# Patient Record
Sex: Female | Born: 1960
Health system: Southern US, Community
[De-identification: ages and names within clinical notes are randomized; demographics above are authoritative.]

## PROBLEM LIST (undated history)

## (undated) DIAGNOSIS — M797 Fibromyalgia: Secondary | ICD-10-CM

## (undated) DIAGNOSIS — K219 Gastro-esophageal reflux disease without esophagitis: Secondary | ICD-10-CM

## (undated) DIAGNOSIS — I10 Essential (primary) hypertension: Secondary | ICD-10-CM

## (undated) DIAGNOSIS — E785 Hyperlipidemia, unspecified: Secondary | ICD-10-CM

## (undated) DIAGNOSIS — R7303 Prediabetes: Secondary | ICD-10-CM

## (undated) DIAGNOSIS — M1990 Unspecified osteoarthritis, unspecified site: Secondary | ICD-10-CM

## (undated) DIAGNOSIS — Z862 Personal history of diseases of the blood and blood-forming organs and certain disorders involving the immune mechanism: Secondary | ICD-10-CM

## (undated) DIAGNOSIS — D759 Disease of blood and blood-forming organs, unspecified: Secondary | ICD-10-CM

## (undated) HISTORY — DX: Prediabetes: R73.03

## (undated) HISTORY — DX: Hyperlipidemia, unspecified: E78.5

---

## 1997-07-24 ENCOUNTER — Other Ambulatory Visit: Admission: RE | Admit: 1997-07-24 | Discharge: 1997-07-24 | Payer: Self-pay | Admitting: Obstetrics and Gynecology

## 1997-09-13 ENCOUNTER — Emergency Department (HOSPITAL_COMMUNITY): Admission: EM | Admit: 1997-09-13 | Discharge: 1997-09-13 | Payer: Self-pay | Admitting: Emergency Medicine

## 1997-09-13 ENCOUNTER — Encounter: Admission: RE | Admit: 1997-09-13 | Discharge: 1997-09-13 | Payer: Self-pay | Admitting: *Deleted

## 1999-12-19 ENCOUNTER — Emergency Department (HOSPITAL_COMMUNITY): Admission: EM | Admit: 1999-12-19 | Discharge: 1999-12-19 | Payer: Self-pay | Admitting: Emergency Medicine

## 1999-12-19 ENCOUNTER — Encounter: Payer: Self-pay | Admitting: Emergency Medicine

## 2000-02-24 ENCOUNTER — Encounter: Payer: Self-pay | Admitting: *Deleted

## 2000-02-24 ENCOUNTER — Emergency Department (HOSPITAL_COMMUNITY): Admission: EM | Admit: 2000-02-24 | Discharge: 2000-02-24 | Payer: Self-pay | Admitting: *Deleted

## 2000-11-09 ENCOUNTER — Encounter: Payer: Self-pay | Admitting: Emergency Medicine

## 2000-11-09 ENCOUNTER — Emergency Department (HOSPITAL_COMMUNITY): Admission: EM | Admit: 2000-11-09 | Discharge: 2000-11-09 | Payer: Self-pay | Admitting: Emergency Medicine

## 2000-11-18 ENCOUNTER — Emergency Department (HOSPITAL_COMMUNITY): Admission: EM | Admit: 2000-11-18 | Discharge: 2000-11-19 | Payer: Self-pay | Admitting: Emergency Medicine

## 2000-11-18 ENCOUNTER — Encounter: Payer: Self-pay | Admitting: Emergency Medicine

## 2002-05-10 ENCOUNTER — Emergency Department (HOSPITAL_COMMUNITY): Admission: EM | Admit: 2002-05-10 | Discharge: 2002-05-10 | Payer: Self-pay | Admitting: Emergency Medicine

## 2002-06-07 ENCOUNTER — Emergency Department (HOSPITAL_COMMUNITY): Admission: EM | Admit: 2002-06-07 | Discharge: 2002-06-07 | Payer: Self-pay | Admitting: *Deleted

## 2004-05-15 ENCOUNTER — Emergency Department (HOSPITAL_COMMUNITY): Admission: EM | Admit: 2004-05-15 | Discharge: 2004-05-15 | Payer: Self-pay | Admitting: Emergency Medicine

## 2004-06-04 ENCOUNTER — Emergency Department (HOSPITAL_COMMUNITY): Admission: EM | Admit: 2004-06-04 | Discharge: 2004-06-04 | Payer: Self-pay | Admitting: Family Medicine

## 2005-05-12 ENCOUNTER — Emergency Department (HOSPITAL_COMMUNITY): Admission: EM | Admit: 2005-05-12 | Discharge: 2005-05-12 | Payer: Self-pay | Admitting: Emergency Medicine

## 2005-12-16 ENCOUNTER — Emergency Department (HOSPITAL_COMMUNITY): Admission: EM | Admit: 2005-12-16 | Discharge: 2005-12-16 | Payer: Self-pay | Admitting: Emergency Medicine

## 2006-11-24 ENCOUNTER — Emergency Department (HOSPITAL_COMMUNITY): Admission: EM | Admit: 2006-11-24 | Discharge: 2006-11-24 | Payer: Self-pay | Admitting: Emergency Medicine

## 2007-12-25 ENCOUNTER — Emergency Department (HOSPITAL_COMMUNITY): Admission: EM | Admit: 2007-12-25 | Discharge: 2007-12-25 | Payer: Self-pay | Admitting: Emergency Medicine

## 2010-04-27 ENCOUNTER — Encounter (INDEPENDENT_AMBULATORY_CARE_PROVIDER_SITE_OTHER): Payer: Self-pay | Admitting: Nurse Practitioner

## 2010-04-27 ENCOUNTER — Encounter: Payer: Self-pay | Admitting: Nurse Practitioner

## 2010-04-27 DIAGNOSIS — I10 Essential (primary) hypertension: Secondary | ICD-10-CM | POA: Insufficient documentation

## 2010-05-05 NOTE — Progress Notes (Signed)
Summary: Office Visit//HEALTH SCREEN  Office Visit//HEALTH SCREEN   Imported By: Arta Bruce 05/01/2010 12:08:41  _____________________________________________________________________  External Attachment:    Type:   Image     Comment:   External Document

## 2010-05-05 NOTE — Assessment & Plan Note (Signed)
Summary: HTN   Vital Signs:  Patient profile:   50 year old female Menstrual status:  regular LMP:     04/2010 Weight:      151.0 pounds Temp:     98.0 degrees F oral Pulse rate:   73 / minute Pulse rhythm:   regular Resp:     16 per minute BP sitting:   166 / 116  (left arm) Cuff size:   regular  Vitals Entered By: Levon Hedger (April 27, 2010 12:36 PM) CC: re establish....needs a physical, Hypertension Management Is Patient Diabetic? No Pain Assessment Patient in pain? no       Does patient need assistance? Functional Status Self care Ambulation Normal LMP (date): 04/2010     Menstrual Status regular Enter LMP: 04/2010   CC:  re establish....needs a physical and Hypertension Management.  History of Present Illness:  Pt into the office to establish care  No acute problems today. She does want to schedule an appt for a CPE  Hypertension History:      She denies headache, chest pain, and palpitations.  she has checked.        Positive major cardiovascular risk factors include hypertension.  Negative major cardiovascular risk factors include female age less than 25 years old, no history of diabetes or hyperlipidemia, and non-tobacco-user status.        Further assessment for target organ damage reveals no history of ASHD, cardiac end-organ damage (CHF/LVH), or stroke/TIA.     Habits & Providers  Alcohol-Tobacco-Diet     Alcohol drinks/day: 0     Tobacco Status: quit < 6 months     Tobacco Counseling: to remain off tobacco products     Year Quit: 02/2010  Exercise-Depression-Behavior     Drug Use: never  Medications Prior to Update: 1)  None  Allergies (verified): No Known Drug Allergies  Past History:  Past Surgical History: c-section x 2 tubal ligation  Family History: mother - deceased when pt was 37 yrs old father - CAD   Social History: 2 children tobacco - quit 02/2010 ETOH - none  drug - noneSmoking Status:  quit < 6 months Drug Use:   never Ethnicity:  Black  Review of Systems General:  Denies fever. CV:  Denies chest pain or discomfort. Resp:  Denies cough. GI:  Denies abdominal pain, nausea, and vomiting. MS:  Denies joint pain.  Physical Exam  General:  alert.   Head:  normocephalic.   Eyes:  glasses Lungs:  normal breath sounds.   Heart:  normal rate and regular rhythm.   Msk:  up to the exam table Neurologic:  alert & oriented X3.   Skin:  color normal.   Psych:  Oriented X3.     Impression & Recommendations:  Problem # 1:  HYPERTENSION, BENIGN ESSENTIAL (ICD-401.1) DASH diet will need to start meds (handout given to pt) Her updated medication list for this problem includes:    Lisinopril-hydrochlorothiazide 10-12.5 Mg Tabs (Lisinopril-hydrochlorothiazide) ..... One tablet by mouth daily for blood pressure  Complete Medication List: 1)  Lisinopril-hydrochlorothiazide 10-12.5 Mg Tabs (Lisinopril-hydrochlorothiazide) .... One tablet by mouth daily for blood pressure  Hypertension Assessment/Plan:      The patient's hypertensive risk group is category A: No risk factors and no target organ damage.  Today's blood pressure is 166/116.  Her blood pressure goal is < 140/90.  Patient Instructions: 1)  Follow up for a blood pressure check in 2 weeks. 2)  Take your medications before  this visit. 3)  Follow up for an appointment for a complete physical exam. 4)  No food after midnight before this visit. 5)  You will need PAP, EKG, mammogram, labs. Prescriptions: LISINOPRIL-HYDROCHLOROTHIAZIDE 10-12.5 MG TABS (LISINOPRIL-HYDROCHLOROTHIAZIDE) One tablet by mouth daily for blood pressure  #30 x 5   Entered and Authorized by:   Lehman Prom FNP   Signed by:   Lehman Prom FNP on 04/27/2010   Method used:   Print then Give to Patient   RxID:   [id number]    Orders Added: 1)  New Patient Level III [14782]

## 2010-07-06 ENCOUNTER — Other Ambulatory Visit (HOSPITAL_COMMUNITY): Payer: Self-pay | Admitting: Family Medicine

## 2010-07-06 ENCOUNTER — Other Ambulatory Visit: Payer: Self-pay | Admitting: Family Medicine

## 2010-07-06 DIAGNOSIS — Z1231 Encounter for screening mammogram for malignant neoplasm of breast: Secondary | ICD-10-CM

## 2010-07-15 ENCOUNTER — Ambulatory Visit (HOSPITAL_COMMUNITY)
Admission: RE | Admit: 2010-07-15 | Discharge: 2010-07-15 | Disposition: A | Discharge status: Home / Self Care | Payer: Medicaid Other | Source: Ambulatory Visit | Attending: Family Medicine | Admitting: Family Medicine

## 2010-07-15 DIAGNOSIS — Z1231 Encounter for screening mammogram for malignant neoplasm of breast: Secondary | ICD-10-CM | POA: Insufficient documentation

## 2010-08-23 ENCOUNTER — Emergency Department (HOSPITAL_COMMUNITY): Payer: Medicaid Other

## 2010-08-23 ENCOUNTER — Emergency Department (HOSPITAL_COMMUNITY)
Admission: EM | Admit: 2010-08-23 | Discharge: 2010-08-23 | Disposition: A | Discharge status: Home / Self Care | Payer: Medicaid Other | Attending: Emergency Medicine | Admitting: Emergency Medicine

## 2010-08-23 DIAGNOSIS — R059 Cough, unspecified: Secondary | ICD-10-CM | POA: Insufficient documentation

## 2010-08-23 DIAGNOSIS — R05 Cough: Secondary | ICD-10-CM | POA: Insufficient documentation

## 2010-08-23 DIAGNOSIS — I10 Essential (primary) hypertension: Secondary | ICD-10-CM | POA: Insufficient documentation

## 2011-08-12 ENCOUNTER — Other Ambulatory Visit (HOSPITAL_COMMUNITY): Payer: Self-pay | Admitting: Family Medicine

## 2011-08-12 DIAGNOSIS — Z1231 Encounter for screening mammogram for malignant neoplasm of breast: Secondary | ICD-10-CM

## 2011-08-13 ENCOUNTER — Ambulatory Visit (HOSPITAL_COMMUNITY)
Admission: RE | Admit: 2011-08-13 | Discharge: 2011-08-13 | Disposition: A | Discharge status: Home / Self Care | Payer: Medicaid Other | Source: Ambulatory Visit | Attending: Family Medicine | Admitting: Family Medicine

## 2011-08-13 DIAGNOSIS — Z1231 Encounter for screening mammogram for malignant neoplasm of breast: Secondary | ICD-10-CM | POA: Insufficient documentation

## 2012-02-28 ENCOUNTER — Ambulatory Visit: Payer: Medicaid Other | Admitting: Family Medicine

## 2012-04-27 ENCOUNTER — Emergency Department (HOSPITAL_COMMUNITY): Payer: Medicaid Other

## 2012-04-27 ENCOUNTER — Emergency Department (HOSPITAL_COMMUNITY)
Admission: EM | Admit: 2012-04-27 | Discharge: 2012-04-27 | Disposition: A | Discharge status: Home / Self Care | Payer: Medicaid Other | Attending: Emergency Medicine | Admitting: Emergency Medicine

## 2012-04-27 ENCOUNTER — Encounter (HOSPITAL_COMMUNITY): Payer: Self-pay | Admitting: Emergency Medicine

## 2012-04-27 DIAGNOSIS — R202 Paresthesia of skin: Secondary | ICD-10-CM

## 2012-04-27 DIAGNOSIS — I10 Essential (primary) hypertension: Secondary | ICD-10-CM | POA: Insufficient documentation

## 2012-04-27 DIAGNOSIS — M47812 Spondylosis without myelopathy or radiculopathy, cervical region: Secondary | ICD-10-CM | POA: Insufficient documentation

## 2012-04-27 DIAGNOSIS — R209 Unspecified disturbances of skin sensation: Secondary | ICD-10-CM | POA: Insufficient documentation

## 2012-04-27 DIAGNOSIS — F17200 Nicotine dependence, unspecified, uncomplicated: Secondary | ICD-10-CM | POA: Insufficient documentation

## 2012-04-27 DIAGNOSIS — M25519 Pain in unspecified shoulder: Secondary | ICD-10-CM | POA: Insufficient documentation

## 2012-04-27 DIAGNOSIS — M25512 Pain in left shoulder: Secondary | ICD-10-CM

## 2012-04-27 DIAGNOSIS — M542 Cervicalgia: Secondary | ICD-10-CM | POA: Insufficient documentation

## 2012-04-27 HISTORY — DX: Essential (primary) hypertension: I10

## 2012-04-27 LAB — POCT I-STAT TROPONIN I: Troponin i, poc: 0 ng/mL (ref 0.00–0.08)

## 2012-04-27 MED ORDER — HYDROCODONE-ACETAMINOPHEN 5-325 MG PO TABS: 1.0000 | ORAL_TABLET | Freq: Four times a day (QID) | ORAL | Status: DC | PRN

## 2012-04-27 MED ORDER — METHOCARBAMOL 500 MG PO TABS: 500.0000 mg | ORAL_TABLET | Freq: Two times a day (BID) | ORAL | Status: DC | PRN

## 2012-04-27 MED ORDER — MELOXICAM 15 MG PO TABS: 15.0000 mg | ORAL_TABLET | Freq: Every day | ORAL | Status: DC

## 2012-04-27 NOTE — ED Notes (Signed)
3 days ago began having lt shoulder pain after awaken while sleeping states began more int he neck area scap area. Minimal movement. No chest pain no sob,

## 2012-04-27 NOTE — ED Provider Notes (Signed)
History     CSN: [id number]  Arrival date & time 04/27/12  1115   First MD Initiated Contact with Patient 04/27/12 1200      Chief Complaint  Patient presents with  . Shoulder Pain    (Consider location/radiation/quality/duration/timing/severity/associated sxs/prior treatment) HPI  52 year old female presents to the emergency Department with chief complaint of left shoulder pain and hand numbness.  Patient states that she woke up approximately one week ago thinking she had "a crick" in her neck.  He says he said the pain has been worsening over the past week.  She states that she has decreased range of motion in her neck due to the pain.  The past 24 hours she has developed numbness and tingling in the left arm and hand.  The patient also complains of pain in the left scapular region and left pectoral region.  Denies DOE, SOB, chest tightness or pressure, radiation to left arm, jaw or back, nausea, or diaphoresis. Patient is a long-term smoker.  She has a past medical history of hypertension, or family history of diabetes and early heart attack.  The patient has no primary care at this time the    Past Medical History  Diagnosis Date  . Hypertension     No past surgical history on file.  No family history on file.  History  Substance Use Topics  . Smoking status: Not on file  . Smokeless tobacco: Not on file  . Alcohol Use: Not on file    OB History   Grav Para Term Preterm Abortions TAB SAB Ect Mult Living                  Review of Systems  Constitutional: Negative for chills, diaphoresis and fatigue.  HENT: Positive for neck pain.   Respiratory: Negative for apnea, chest tightness and shortness of breath.   Cardiovascular: Negative for chest pain, palpitations and leg swelling.  Gastrointestinal: Negative for nausea and vomiting.  Neurological: Negative for dizziness, facial asymmetry, weakness and light-headedness.    Allergies  Review of patient's allergies  indicates not on file.  Home Medications  No current outpatient prescriptions on file.  BP 184/89  Pulse 60  Temp(Src) 98.1 F (36.7 C) (Oral)  Resp 16  SpO2 100%  Physical Exam  Constitutional: She is oriented to person, place, and time. She appears well-developed and well-nourished. No distress.  HENT:  Head: Normocephalic and atraumatic.  Eyes: Conjunctivae are normal. No scleral icterus.  Neck: Normal range of motion.  Cardiovascular: Normal rate, regular rhythm and normal heart sounds.  Exam reveals no gallop and no friction rub.   No murmur heard. Pulmonary/Chest: Effort normal and breath sounds normal. No respiratory distress. She exhibits tenderness.  Abdominal: Soft. Bowel sounds are normal. She exhibits no distension and no mass. There is no tenderness. There is no guarding.  Musculoskeletal:  Patient with normal strength in the glenohumeral joint. Grip strength 5 out of 5.  Patient's range of motion is limited due to her pain.  She has a positive left-sided axial load test of the neck.  Recommitment motion is severely limited due to her pain.  She is tender to palpation in the left trapezius and pectoralis muscle.  Lymphadenopathy:    She has no cervical adenopathy.  Neurological: She is alert and oriented to person, place, and time.  Skin: Skin is warm and dry. She is not diaphoretic.    ED Course  Procedures (including critical care time)  Labs Reviewed -  No data to display No results found.   1. Neck pain on left side   2. Shoulder pain, left   3. Paresthesia of left arm   4. Facet arthropathy, cervical       MDM  1:10 PM BP 184/89  Pulse 60  Temp(Src) 98.1 F (36.7 C) (Oral)  Resp 16  SpO2 100%  LMP 01/23/2012 Patient with some hypertension.  She has new neurologic symptoms except for the numbness in her left hand.  I suspect the patient has a developing radiculopathy.  The patient has reproducible pain with orthopedic testing and palpation.  We  will obtain an chest x-ray and EKG to look for any ischemic changes and a heart orbit this is very low on my differential but due to the patient's risk factors I decided to obtain these screening tests.   1:42 PM Patient with flipped T waves int the EKG.  No previous to compare.  WIll obtain troponin to r/o MI.  Patient has had PAIn for 1 week.   Date: 04/27/2012  Rate: 50     Rhythm: normal sinus rhythm  QRS Axis: normal  Intervals: normal  ST/T Wave abnormalities: nonspecific Twave abnormlities  Conduction Disutrbances: none  Narrative Interpretation:  Normal ecg with t wave inversion lateral leads, I and aVL  Old EKG Reviewed: No significant changes noted   2:37 PM Patient is with negative troponin.  Will tx for pain. Ortho follow up.        Arthor Captain, PA-C 04/27/12 1545

## 2012-04-27 NOTE — ED Notes (Signed)
Patient transported to X-ray 

## 2012-04-28 NOTE — ED Provider Notes (Signed)
Medical screening examination/treatment/procedure(s) were performed by non-physician practitioner and as supervising physician I was immediately available for consultation/collaboration.   Whitney Plunkett, MD 04/28/12 1544 

## 2012-05-05 ENCOUNTER — Emergency Department (HOSPITAL_COMMUNITY)
Admission: EM | Admit: 2012-05-05 | Discharge: 2012-05-05 | Disposition: A | Discharge status: Home / Self Care | Payer: Medicaid Other | Attending: Emergency Medicine | Admitting: Emergency Medicine

## 2012-05-05 ENCOUNTER — Encounter (HOSPITAL_COMMUNITY): Payer: Self-pay | Admitting: Emergency Medicine

## 2012-05-05 DIAGNOSIS — F17200 Nicotine dependence, unspecified, uncomplicated: Secondary | ICD-10-CM | POA: Insufficient documentation

## 2012-05-05 DIAGNOSIS — Z79899 Other long term (current) drug therapy: Secondary | ICD-10-CM | POA: Insufficient documentation

## 2012-05-05 DIAGNOSIS — M5380 Other specified dorsopathies, site unspecified: Secondary | ICD-10-CM | POA: Insufficient documentation

## 2012-05-05 DIAGNOSIS — I10 Essential (primary) hypertension: Secondary | ICD-10-CM | POA: Insufficient documentation

## 2012-05-05 MED ORDER — OXYCODONE-ACETAMINOPHEN 5-325 MG PO TABS: 1.0000 | ORAL_TABLET | ORAL | Status: DC | PRN

## 2012-05-05 MED ORDER — DIAZEPAM 5 MG PO TABS: 5.0000 mg | ORAL_TABLET | Freq: Two times a day (BID) | ORAL | Status: DC

## 2012-05-05 MED ORDER — HYDROMORPHONE HCL PF 1 MG/ML IJ SOLN
1.0000 mg | Freq: Once | INTRAMUSCULAR | Status: AC
  Administered 2012-05-05: 1 mg via INTRAMUSCULAR
  Filled 2012-05-05: qty 1

## 2012-05-05 NOTE — ED Provider Notes (Signed)
Medical screening examination/treatment/procedure(s) were performed by non-physician practitioner and as supervising physician I was immediately available for consultation/collaboration.   Lyanne Co, MD 05/05/12 [phone]

## 2012-05-05 NOTE — ED Notes (Signed)
Pt seen here at [facility] and dx with Facet Syndrome. Today still having pain in upper left arm, left shoulder and left wrist. Pain 10/10. Pt has appt with Dr Magnus Ivan in 4 days. Out of pain meds.

## 2012-05-05 NOTE — Progress Notes (Signed)
During [facility] ED 05/05/12 visit CM spoke with pt who confirms self paywith no pcp. CM discussed and provided written information for self pay pcps, importance of pcp for f/u care, www.needymeds.org, discounted pharmacies, MATCH program and other [place] resources such as financial assistance, DSS and  health department Reviewed Health connect number to assist with finding self pay provider close to pt's residence. Reviewed resources for [place] self pay pcps like [facility], family medicine at [address], [facility] family practice, general medical clinics, [facility] urgent care plus others, [facility] out patient [facility]ies, housing, affordable care act/health reform (deadline 05/15/12) and other resources in [employer]. Pt voiced understanding and appreciation of resources provided   Pt states she has been previously been seen by [facility] liaison while at [facility] ED previously but has not followed up with "[facility]" She informed CM she missed her appointment

## 2012-05-05 NOTE — ED Provider Notes (Signed)
History     CSN: [id number]  Arrival date & time 05/05/12  1014   First MD Initiated Contact with Patient 05/05/12 1055      No chief complaint on file.   (Consider location/radiation/quality/duration/timing/severity/associated sxs/prior treatment) HPI Comments: 52 year old female presents with previous complaint of left sided pain radiating from shoulder, scapula, and pectoral regions. Pt had a CT in the ED last week that diagnosed her with facet syndrome. Pt states she is out of pain meds prescribed at that visit and her appointment with Dr. Magnus Ivan is not until next week.   Pain is 10/10. Nothing makes it better or worse. The pain is constant and radiates from shoulder, to scapula, to pectoral region.   Denies new injury, DOE, SOB, chest tightness or pressure, radiation to left arm, jaw or back, nausea, or diaphoresis.  Patient is a 52 y.o. female presenting with musculoskeletal pain.  Muscle Pain This is a recurrent problem. The current episode started in the past 7 days. The problem occurs constantly. The problem has been rapidly worsening. Associated symptoms include arthralgias and myalgias. Pertinent negatives include no chest pain, diaphoresis, fever, headaches, nausea, neck pain, numbness, rash, vomiting or weakness. Nothing aggravates the symptoms. She has tried oral narcotics for the symptoms. The treatment provided mild relief.    Past Medical History  Diagnosis Date  . Hypertension     History reviewed. No pertinent past surgical history.  No family history on file.  History  Substance Use Topics  . Smoking status: Current Every Day Smoker  . Smokeless tobacco: Not on file  . Alcohol Use: No    OB History   Grav Para Term Preterm Abortions TAB SAB Ect Mult Living                  Review of Systems  Constitutional: Negative for fever and diaphoresis.  HENT: Negative for neck pain and neck stiffness.   Eyes: Negative for visual disturbance.  Respiratory:  Negative for apnea, chest tightness and shortness of breath.   Cardiovascular: Negative for chest pain and palpitations.  Gastrointestinal: Negative for nausea, vomiting, diarrhea and constipation.  Genitourinary: Negative for dysuria.  Musculoskeletal: Positive for myalgias and arthralgias. Negative for gait problem.       Significant pain left shoulder, trapezius, pectoral areas  Skin: Negative for rash.  Neurological: Negative for dizziness, weakness, light-headedness, numbness and headaches.    Allergies  Review of patient's allergies indicates no known allergies.  Home Medications   Current Outpatient Rx  Name  Route  Sig  Dispense  Refill  . HYDROcodone-acetaminophen (NORCO) 5-325 MG per tablet   Oral   Take 1-2 tablets by mouth every 6 (six) hours as needed for pain.   20 tablet   0   . meloxicam (MOBIC) 15 MG tablet   Oral   Take 15 mg by mouth daily.         . methocarbamol (ROBAXIN) 500 MG tablet   Oral   Take 500 mg by mouth 2 (two) times daily as needed (for muscle spasm.).         Marland Kitchen naproxen sodium (ANAPROX) 220 MG tablet   Oral   Take 440 mg by mouth 2 (two) times daily as needed (for pain.).           BP 164/95  Pulse 67  Temp(Src) 98 F (36.7 C) (Oral)  Resp 18  SpO2 99%  LMP 01/23/2012  Physical Exam  Nursing note and vitals reviewed. Constitutional:  She is oriented to person, place, and time. She appears well-developed and well-nourished. No distress.  HENT:  Head: Normocephalic and atraumatic.  Eyes: Conjunctivae and EOM are normal.  Neck: Normal range of motion. Neck supple.  No meningeal signs  Cardiovascular: Normal rate, regular rhythm and normal heart sounds.   Pulmonary/Chest: Effort normal and breath sounds normal. No respiratory distress. She has no wheezes. She has no rales. She exhibits no tenderness.  Abdominal: Soft. Bowel sounds are normal. She exhibits no distension. There is no tenderness. There is no rebound and no  guarding.  Musculoskeletal: Normal range of motion. She exhibits no edema and no tenderness.  No step-offs. ROM limited to pain at left shoulder. Normal strength. Positive left-sided axial load test of the neck.  Recommitment motion is severely limited due to her pain. Tender to palpation in the left trapezius and pectoralis muscle. Moves extremities without ataxia, coordination intact. Normal gait and balance   Neurological: She is alert and oriented to person, place, and time. No cranial nerve deficit.  No focal deficits. Speech is clear and goal oriented, follows commands Sensation normal to light and sharp touch   Skin: Skin is warm and dry. She is not diaphoretic. No erythema.    ED Course  Procedures (including critical care time)  Labs Reviewed - No data to display No results found.   1. Facet syndrome       MDM  Pt was prescribed a significant amount of pain medication last week. Explained to the pt that I could not prescribe equal amounts this visit. Was able to control pt pain in ED and pt was grateful. Directed pt to try to get an earlier appt with Dr. Magnus Ivan.  At this time there does not appear to be any evidence of an acute emergency medical condition and the patient appears stable for discharge with appropriate outpatient follow up.Diagnosis was discussed with patient who verbalizes understanding and is agreeable to discharge.   Glade Nurse, PA-C 05/05/12 1238

## 2012-05-18 ENCOUNTER — Ambulatory Visit: Payer: Medicaid Other | Attending: Orthopaedic Surgery | Admitting: Physical Therapy

## 2012-05-18 DIAGNOSIS — M25519 Pain in unspecified shoulder: Secondary | ICD-10-CM | POA: Insufficient documentation

## 2012-05-18 DIAGNOSIS — IMO0001 Reserved for inherently not codable concepts without codable children: Secondary | ICD-10-CM | POA: Insufficient documentation

## 2012-05-18 DIAGNOSIS — M542 Cervicalgia: Secondary | ICD-10-CM | POA: Insufficient documentation

## 2012-05-26 ENCOUNTER — Ambulatory Visit: Payer: Medicaid Other | Admitting: Physical Therapy

## 2012-06-01 ENCOUNTER — Ambulatory Visit: Payer: Medicaid Other | Admitting: Physical Therapy

## 2012-06-07 ENCOUNTER — Other Ambulatory Visit: Payer: Self-pay | Admitting: Orthopaedic Surgery

## 2012-06-07 DIAGNOSIS — M542 Cervicalgia: Secondary | ICD-10-CM

## 2012-06-13 ENCOUNTER — Other Ambulatory Visit: Payer: Medicaid Other

## 2012-06-17 ENCOUNTER — Ambulatory Visit
Admission: RE | Admit: 2012-06-17 | Discharge: 2012-06-17 | Disposition: A | Discharge status: Home / Self Care | Payer: Medicaid Other | Source: Ambulatory Visit | Attending: Orthopaedic Surgery | Admitting: Orthopaedic Surgery

## 2012-06-17 DIAGNOSIS — G8929 Other chronic pain: Secondary | ICD-10-CM

## 2012-07-05 ENCOUNTER — Encounter (HOSPITAL_COMMUNITY): Payer: Self-pay | Admitting: Pharmacy Technician

## 2012-07-08 NOTE — Pre-Procedure Instructions (Signed)
Debbie Clarke  07/08/2012   Your procedure is scheduled on:  Mon, June 2 @ 12:30 PM  Report to [facility] at 10:30 AM.  Call this number if you have problems the morning of surgery: [phone]   Remember:   Do not eat food or drink liquids after midnight.   Take these medicines the morning of surgery with A SIP OF WATER: Pain Pill(if needed)   Do not wear jewelry, make-up or nail polish.  Do not wear lotions, powders, or perfumes. You may wear deodorant.  Do not shave 48 hours prior to surgery.   Do not bring valuables to the hospital.  Contacts, dentures or bridgework may not be worn into surgery.  Leave suitcase in the car. After surgery it may be brought to your room.  For patients admitted to the hospital, checkout time is 11:00 AM the day of  discharge.   Patients discharged the day of surgery will not be allowed to drive  home.    Special Instructions: Shower using CHG 2 nights before surgery and the night before surgery.  If you shower the day of surgery use CHG.  Use special wash - you have one bottle of CHG for all showers.  You should use approximately 1/3 of the bottle for each shower.   Please read over the following fact sheets that you were given: Pain Booklet, Coughing and Deep Breathing, MRSA Information and Surgical Site Infection Prevention

## 2012-07-11 ENCOUNTER — Encounter (HOSPITAL_COMMUNITY)
Admission: RE | Admit: 2012-07-11 | Discharge: 2012-07-11 | Disposition: A | Discharge status: Home / Self Care | Payer: Medicaid Other | Source: Ambulatory Visit | Attending: Orthopaedic Surgery | Admitting: Orthopaedic Surgery

## 2012-07-12 NOTE — H&P (Signed)
  [facility]   A Division of [employer] [facility]   [address] [place], [place] [address] Telephone: [phone]  Fax: [phone]     PATIENT: Debbie Clarke, Debbie Clarke   MR#: [id number]  DOB: 22-Jun-1960       A 52 year old female is seen with excruciating neck pain, left arm pain, and left arm weakness.  She has been to the emergency room.  Her symptoms have progressed.  Have been present since February.  She has been taking narcotic medication, has been through physical therapy without relief, has been taking narcotics and muscle relaxants without relief.  MRI from 06/17/2012 showed a large posterolateral disk rupture with flattening of the cord, severe left C7 nerve root compression without myelopathic changes in the cord.  Patient states that she does not have any strength with pushing in her arm.  She has spondylitic changes at the C5-6 level as well with a disk protrusion mildly asymptomatic to the left with borderline central stenosis at 10 mm AP diameter with bilateral foraminal narrowing.  Other levels show minimal changes and no evidence of significant compression.   ALLERGIES:  Patient has no allergies.   PAST SURGICAL HISTORY:  Include C-section in 1985 and 1998; children are doing well.   FAMILY HISTORY:  Positive for diabetes, numerous cancers, including leukemia, metastatic bone cancer, cervical cancer, and prostate cancer.   SOCIAL HISTORY:  Patient is single.  She is unemployed.  Does not smoke or drink; she was a past smoker for 16 years.   REVIEW OF SYSTEMS:  Positive for acid reflux, anxiety, depression, history of DVT, hypertension, migraines, sleep apnea.   PHYSICAL EXAMINATION:  Patient is in significant distress.  She is crying.  She holds her head with her hand.  She has 30 degrees of rotation with severe radicular left arm pain with positive Spurling, negative Lhermitte.  Triceps is absent on the left, 2+ on the right.  Biceps reflexes  are 2+ and symmetrical. She has severe triceps weakness; I can overcome it with 1 finger.  Wrist flexion weakness similarly weak on the left, normal on the right.  Finger extensors are mildly weak.  FCU is normal.  Interossei are normal to testing.  Normal biceps.  Deltoids are normal.  Severe brachial plexus tenderness on the left, minimal on the right.  Rotator cuff strengthening is normal.   ASSESSMENT:  Large HNP with cord compression C6-7 and left C7 radiculopathy and cervical spondylosis at C5-6 with central stenosis and bilateral foraminal narrowing.   PLAN:  We discussed options with her.  With the large disk herniation and progressive weakness of her arm, I would recommend a 2-level cervical fusion, allograft and plate.  Procedure discussed.  Indications for surgery discussed.  She understands and requests we proceed.         Mark C. Ophelia Charter, M.D.    Auto-Authenticated by Veverly Fells. Ophelia Charter, M.D.

## 2012-07-29 ENCOUNTER — Emergency Department (HOSPITAL_COMMUNITY)
Admission: EM | Admit: 2012-07-29 | Discharge: 2012-07-30 | Disposition: A | Discharge status: Home / Self Care | Payer: Medicaid Other | Attending: Emergency Medicine | Admitting: Emergency Medicine

## 2012-07-29 ENCOUNTER — Encounter (HOSPITAL_COMMUNITY): Payer: Self-pay | Admitting: *Deleted

## 2012-07-29 DIAGNOSIS — R42 Dizziness and giddiness: Secondary | ICD-10-CM | POA: Insufficient documentation

## 2012-07-29 DIAGNOSIS — I10 Essential (primary) hypertension: Secondary | ICD-10-CM

## 2012-07-29 DIAGNOSIS — Z888 Allergy status to other drugs, medicaments and biological substances status: Secondary | ICD-10-CM | POA: Insufficient documentation

## 2012-07-29 DIAGNOSIS — T50995A Adverse effect of other drugs, medicaments and biological substances, initial encounter: Secondary | ICD-10-CM | POA: Insufficient documentation

## 2012-07-29 DIAGNOSIS — F17200 Nicotine dependence, unspecified, uncomplicated: Secondary | ICD-10-CM | POA: Insufficient documentation

## 2012-07-29 DIAGNOSIS — T50905A Adverse effect of unspecified drugs, medicaments and biological substances, initial encounter: Secondary | ICD-10-CM

## 2012-07-29 DIAGNOSIS — R110 Nausea: Secondary | ICD-10-CM | POA: Insufficient documentation

## 2012-07-29 LAB — CBC WITH DIFFERENTIAL/PLATELET
Basophils Absolute: 0 10*3/uL (ref 0.0–0.1)
Basophils Relative: 0 % (ref 0–1)
Eosinophils Absolute: 0.1 10*3/uL (ref 0.0–0.7)
Eosinophils Relative: 1 % (ref 0–5)
Lymphs Abs: 2.2 10*3/uL (ref 0.7–4.0)
MCH: 29.5 pg (ref 26.0–34.0)
MCHC: 35.7 g/dL (ref 30.0–36.0)
MCV: 82.8 fL (ref 78.0–100.0)
Neutrophils Relative %: 56 % (ref 43–77)
Platelets: 217 10*3/uL (ref 150–400)
RBC: 5.11 MIL/uL (ref 3.87–5.11)
RDW: 14.6 % (ref 11.5–15.5)

## 2012-07-29 LAB — BASIC METABOLIC PANEL
Calcium: 10.2 mg/dL (ref 8.4–10.5)
GFR calc Af Amer: >90 mL/min (ref >90)
GFR calc non Af Amer: >90 mL/min (ref >90)
Potassium: 3.5 mEq/L (ref 3.5–5.1)
Sodium: 140 mEq/L (ref 135–145)

## 2012-07-29 LAB — URINALYSIS, ROUTINE W REFLEX MICROSCOPIC
Hgb urine dipstick: NEGATIVE
Nitrite: NEGATIVE
Protein, ur: NEGATIVE mg/dL
Specific Gravity, Urine: 1.01 (ref 1.005–1.030)
Urobilinogen, UA: 0.2 mg/dL (ref 0.0–1.0)

## 2012-07-29 LAB — TROPONIN I: Troponin I: <0.3 ng/mL (ref <0.30)

## 2012-07-29 LAB — URINE MICROSCOPIC-ADD ON

## 2012-07-29 MED ORDER — SODIUM CHLORIDE 0.9 % IV BOLUS (SEPSIS)
1000.0000 mL | Freq: Once | INTRAVENOUS | Status: AC
  Administered 2012-07-29: 1000 mL via INTRAVENOUS

## 2012-07-29 NOTE — ED Notes (Signed)
[id number]<AV> Expected date:<BR> Expected time:<BR> Means of arrival:<BR> Comments:<BR> Ems

## 2012-07-29 NOTE — ED Provider Notes (Signed)
History     CSN: [id number]  Arrival date & time 07/29/12  2124   First MD Initiated Contact with Patient 07/29/12 2132      Chief Complaint  Patient presents with  . Weakness  . Dizziness  . Nausea    (Consider location/radiation/quality/duration/timing/severity/associated sxs/prior treatment) Patient is a 52 y.o. female presenting with weakness. The history is provided by the patient. No language interpreter was used.  Weakness This is a new problem. The current episode started today. Associated symptoms include nausea and weakness. Pertinent negatives include no abdominal pain, chest pain, chills, fever or vomiting. Associated symptoms comments: Two days ago she had mild nausea without vomiting or diarrhea. Today she started having severe lightheadedness and near syncope without full syncope. She reports that her doctor changed her blood pressure medication 2 days ago, but no other medication changes. No fever. She denies pain, SOB..    Past Medical History  Diagnosis Date  . Hypertension     History reviewed. No pertinent past surgical history.  History reviewed. No pertinent family history.  History  Substance Use Topics  . Smoking status: Current Every Day Smoker  . Smokeless tobacco: Not on file  . Alcohol Use: No    OB History   Grav Para Term Preterm Abortions TAB SAB Ect Mult Living                  Review of Systems  Constitutional: Negative for fever and chills.  HENT: Negative.  Negative for neck stiffness.   Respiratory: Negative.  Negative for shortness of breath.   Cardiovascular: Negative.  Negative for chest pain.  Gastrointestinal: Positive for nausea. Negative for vomiting and abdominal pain.  Genitourinary: Negative for dysuria.  Musculoskeletal: Negative.   Skin: Negative.   Neurological: Positive for dizziness, weakness and light-headedness.  Psychiatric/Behavioral: Negative for confusion.    Allergies  Review of patient's allergies  indicates no known allergies.  Home Medications   Current Outpatient Rx  Name  Route  Sig  Dispense  Refill  . Aspirin-Acetaminophen (GOODY BODY PAIN) 500-325 MG PACK   Oral   Take 1 Package by mouth daily.         . cyclobenzaprine (FLEXERIL) 10 MG tablet   Oral   Take 5-10 mg by mouth 3 (three) times daily as needed for muscle spasms.         Marland Kitchen HYDROcodone-acetaminophen (NORCO/VICODIN) 5-325 MG per tablet   Oral   Take 1 tablet by mouth every 4 (four) hours as needed for pain.         . Olmesartan-Amlodipine-HCTZ (TRIBENZOR) 40-10-25 MG TABS   Oral   Take 1 tablet by mouth daily.           BP 141/84  Pulse 50  Temp(Src) 99 F (37.2 C) (Oral)  Resp 12  SpO2 100%  Physical Exam  Constitutional: She is oriented to person, place, and time. She appears well-developed and well-nourished.  HENT:  Head: Normocephalic.  Neck: Normal range of motion. Neck supple.  Cardiovascular: Normal rate and regular rhythm.   Pulmonary/Chest: Effort normal and breath sounds normal.  Abdominal: Soft. Bowel sounds are normal. There is no tenderness. There is no rebound and no guarding.  Musculoskeletal: Normal range of motion.  Neurological: She is alert and oriented to person, place, and time.  Cranial nerves 3-12 grossly intact. No lateralized weakness or sensory discrepancies.   Skin: Skin is warm and dry. No rash noted.  Psychiatric: She has a normal  mood and affect.    ED Course  Procedures (including critical care time)  Labs Reviewed  CBC WITH DIFFERENTIAL  BASIC METABOLIC PANEL  URINALYSIS, ROUTINE W REFLEX MICROSCOPIC  TROPONIN I   Results for orders placed during the hospital encounter of 07/29/12  CBC WITH DIFFERENTIAL      Result Value Range   WBC 6.3  4.0 - 10.5 K/uL   RBC 5.11  3.87 - 5.11 MIL/uL   Hemoglobin 15.1 (*) 12.0 - 15.0 g/dL   HCT 16.1  09.6 - 04.5 %   MCV 82.8  78.0 - 100.0 fL   MCH 29.5  26.0 - 34.0 pg   MCHC 35.7  30.0 - 36.0 g/dL   RDW  40.9  81.1 - 91.4 %   Platelets 217  150 - 400 K/uL   Neutrophils Relative % 56  43 - 77 %   Neutro Abs 3.5  1.7 - 7.7 K/uL   Lymphocytes Relative 34  12 - 46 %   Lymphs Abs 2.2  0.7 - 4.0 K/uL   Monocytes Relative 8  3 - 12 %   Monocytes Absolute 0.5  0.1 - 1.0 K/uL   Eosinophils Relative 1  0 - 5 %   Eosinophils Absolute 0.1  0.0 - 0.7 K/uL   Basophils Relative 0  0 - 1 %   Basophils Absolute 0.0  0.0 - 0.1 K/uL  BASIC METABOLIC PANEL      Result Value Range   Sodium 140  135 - 145 mEq/L   Potassium 3.5  3.5 - 5.1 mEq/L   Chloride 95 (*) 96 - 112 mEq/L   CO2 34 (*) 19 - 32 mEq/L   Glucose, Bld 95  70 - 99 mg/dL   BUN 10  6 - 23 mg/dL   Creatinine, Ser 7.82  0.50 - 1.10 mg/dL   Calcium 95.6  8.4 - 21.3 mg/dL   GFR calc non Af Amer >90  >90 mL/min   GFR calc Af Amer >90  >90 mL/min  URINALYSIS, ROUTINE W REFLEX MICROSCOPIC      Result Value Range   Color, Urine YELLOW  YELLOW   APPearance CLEAR  CLEAR   Specific Gravity, Urine 1.010  1.005 - 1.030   pH 6.5  5.0 - 8.0   Glucose, UA NEGATIVE  NEGATIVE mg/dL   Hgb urine dipstick NEGATIVE  NEGATIVE   Bilirubin Urine NEGATIVE  NEGATIVE   Ketones, ur NEGATIVE  NEGATIVE mg/dL   Protein, ur NEGATIVE  NEGATIVE mg/dL   Urobilinogen, UA 0.2  0.0 - 1.0 mg/dL   Nitrite NEGATIVE  NEGATIVE   Leukocytes, UA SMALL (*) NEGATIVE  TROPONIN I      Result Value Range   Troponin I <0.30  <0.30 ng/mL  URINE MICROSCOPIC-ADD ON      Result Value Range   Squamous Epithelial / LPF FEW (*) RARE   WBC, UA 3-6  <3 WBC/hpf   Urine-Other TRICHOMONAS PRESENT      No results found.   No diagnosis found. 1. Adverse medication reaction 2. Hypertension    MDM  Feeling better with fluids, rest. Discussed with Dr. Silverio Lay. Discussed new blood pressure medication as probable cause of symptoms and will stop. Will change back to previous medication (Lisinopril/HCTZ) and she will follow up with her doctor this week.         Arnoldo Hooker,  PA-C 07/30/12 [phone]

## 2012-07-29 NOTE — ED Notes (Signed)
Per EMS pt started new bp medication yesterday and has felt weak , dizzy and slipped down stairs,  Pt reported to be semi lethargic,  IV 20 gauge right forearm  zofran 4mg  IV given

## 2012-07-29 NOTE — ED Notes (Signed)
Patient unable to urinate at this time. 

## 2012-07-30 ENCOUNTER — Telehealth (HOSPITAL_COMMUNITY): Payer: Self-pay | Admitting: Emergency Medicine

## 2012-07-30 MED ORDER — LISINOPRIL-HYDROCHLOROTHIAZIDE 20-12.5 MG PO TABS: 1.0000 | ORAL_TABLET | Freq: Every day | ORAL | Status: DC

## 2012-07-30 NOTE — ED Notes (Signed)
Patient abumlated fine 1 time around the department.

## 2012-07-30 NOTE — ED Notes (Signed)
Call from [facility] regarding Rx dose of Lisinopril -Hydrochlorothiazide 20 mg-12.5 mg pts old dose was Lisinopril -Hydrochlorothiazide 20 mg-25 mg.  S. Upstill PA provider who wrote script consulted and pt was unable to remember dose while in the ED but the PA would like pts old dose to be given.  Pharmacist informed of change.

## 2012-07-30 NOTE — ED Provider Notes (Signed)
Medical screening examination/treatment/procedure(s) were performed by non-physician practitioner and as supervising physician I was immediately available for consultation/collaboration.   David H Yao, MD 07/30/12 1453 

## 2012-08-01 ENCOUNTER — Other Ambulatory Visit (HOSPITAL_COMMUNITY): Payer: Self-pay | Admitting: Orthopaedic Surgery

## 2012-08-01 ENCOUNTER — Encounter (HOSPITAL_COMMUNITY): Payer: Self-pay

## 2012-08-01 ENCOUNTER — Encounter (HOSPITAL_COMMUNITY)
Admission: RE | Admit: 2012-08-01 | Discharge: 2012-08-01 | Disposition: A | Discharge status: Home / Self Care | Payer: Medicaid Other | Source: Ambulatory Visit | Attending: Orthopaedic Surgery | Admitting: Orthopaedic Surgery

## 2012-08-01 HISTORY — DX: Disease of blood and blood-forming organs, unspecified: D75.9

## 2012-08-01 HISTORY — DX: Gastro-esophageal reflux disease without esophagitis: K21.9

## 2012-08-01 LAB — CBC
HCT: 38.8 % (ref 36.0–46.0)
Hemoglobin: 13.9 g/dL (ref 12.0–15.0)
MCV: 83.3 fL (ref 78.0–100.0)
WBC: 4.8 10*3/uL (ref 4.0–10.5)

## 2012-08-01 LAB — BASIC METABOLIC PANEL
BUN: 9 mg/dL (ref 6–23)
CO2: 29 mEq/L (ref 19–32)
Chloride: 102 mEq/L (ref 96–112)
Creatinine, Ser: 0.67 mg/dL (ref 0.50–1.10)
GFR calc Af Amer: >90 mL/min (ref >90)
Potassium: 3.5 mEq/L (ref 3.5–5.1)

## 2012-08-01 LAB — SURGICAL PCR SCREEN: MRSA, PCR: NEGATIVE

## 2012-08-01 NOTE — Pre-Procedure Instructions (Signed)
Debbie Clarke  08/01/2012   Your procedure is scheduled on:  Monday August 07, 2012  Report to [facility] at 1030 AM.  Call this number if you have problems the morning of surgery: [phone]   Remember:   Do not eat food or drink liquids after midnight Sunday   Take these medicines the morning of surgery with A SIP OF WATER: Pain med if needed for pain   Do not wear jewelry, make-up or nail polish.  Do not wear lotions, powders, or perfumes. You may wear deodorant.  Do not shave 48 hours prior to surgery.   Do not bring valuables to the hospital.  [facility] is not responsible                   for any belongings or valuables.  Contacts, dentures or bridgework may not be worn into surgery.  Leave suitcase in the car. After surgery it may be brought to your room.  For patients admitted to the hospital, checkout time is 11:00 AM the day of  discharge.   Patients discharged the day of surgery will not be allowed to drive  home.   Special Instructions: Shower using CHG 2 nights before surgery and the night before surgery.  If you shower the day of surgery use CHG.  Use special wash - you have one bottle of CHG for all showers.  You should use approximately 1/3 of the bottle for each shower.   Please read over the following fact sheets that you were given: Pain Booklet, Coughing and Deep Breathing, MRSA Information and Surgical Site Infection Prevention

## 2012-08-03 NOTE — H&P (Signed)
[facility]   A Division of [employer]   [address], [place], [place] [address] Telephone: [phone]  Fax: [phone]     PATIENT: Debbie Clarke, Debbie Clarke   MR#: [id number]  DOB: [birth year]      CHIEF COMPLAINT: neck pain and left >right arm pain This 52 year old female returns with persistent severe neck pain and left arm weakness and now some right arm weakness with radiculopathy.  MRI documented a large   posterolateral foraminal disk herniation at C6-7.  She has mildly severe spondylitic changes at the C5-6 level with narrowing of the AP diameter down to 10 mm and her pain and weakness has progressed.  She is now scheduled for surgery later this month.  The onset of symptoms was back in February 2014.  She has been treated with a Medrol Dosepak, pain medication, muscle relaxants, Flexeril, Robaxin, Valium, Norco, Naprosyn.  She has been through physical therapy and her symptoms have persisted.     ALLERGIES:  Patient has no known drug allergies.   PAST SURGICAL HISTORY:  Include cesarean section in 1985 and 1998.  Her children are doing well.   FAMILY HISTORY:  Positive for diabetes, numerous cancers including leukemia, metastatic bone cancer, cervical cancer, and prostate cancer.   SOCIAL HISTORY:  Patient is single.  She is currently not working.  Does not smoke or drink.  She was a past smoker for 16 years.   REVIEW OF SYSTEMS:  Positive for acid reflux, anxiety, depression, history of DVT.  The DVT was at the time of her the end of her pregnancy.  She states that she was not actually ever on subcutaneous Heparin.  She may have had some lower extremity swelling related to fetal positioning and some venous compression.  Once she stopped driving the [place] and was in more extended position she had resolution of her symptoms.  She also has a history of migraines.  She has some problems with occasional sleep disturbance but does not have sleep apnea.   She never had a sleep study.  Review of systems also positive for hypertension.     PHYSICAL EXAMINATION:  The patient is alert and oriented.  WDWN.  In moderate distress with neck and left arm pain and numbness and weakness.  Patient has no accessory muscle inspiratory effort.  Lungs are clear.  Heart regular rate and rhythm.  Abdomen is soft and nontender.  She has absence of triceps reflex on the left.  The triceps weakness is able to be overcome with 1 finger resisted pressure.  Weakness of FCU on the left only.  None of the right.  Finger extension is weak.  Biceps and deltoid are normal.  She has severe brachial plexus tenderness and positive Spurling on the left.  Normal gait.  No long track signs.     RADIOGRAPHS/TEST:  MRI scan 06/17/2012 shows spondylosis, posterior osteophyte ridging and disk protrusion asymmetric to the left with narrowing of the canal below 10 mm with no cord deformity.  This is at the C5-6 level.  At the C6-7 level she has a large posterolateral disk extrusion with cord flattening and left C7 nerve root compression.  Other levels show some mild facet changes and mild narrowing.      ASSESSMENT:  Spondylosis C5-6 and C6-7 with large extruded disk with cord deformity C6-7 and left C7 nerve root compression and radiculopathy.      PLAN:  We will proceed with 2-level anterior cervical diskectomy and  fusion.  Overnight stay in the hospital and use of a soft collar were discussed.  We discussed the risks of bleeding, pseudarthrosis, reoperation, vocal cord problems, possibility of needing a posterior fusion if the anterior procedure did not heal.  All questions were answered and she requests we proceed.   For additional information please see handwritten notes, reports, orders and prescriptions in this chart.      Mark C. Ophelia Charter, M.D.    Auto-Authenticated by Veverly Fells. Ophelia Charter, M.D.

## 2012-08-06 MED ORDER — CEFAZOLIN SODIUM-DEXTROSE 2-3 GM-% IV SOLR
2.0000 g | INTRAVENOUS | Status: AC
  Administered 2012-08-07: 2 g via INTRAVENOUS
  Filled 2012-08-06: qty 50

## 2012-08-07 ENCOUNTER — Ambulatory Visit (HOSPITAL_COMMUNITY): Payer: Medicaid Other | Admitting: Anesthesiology

## 2012-08-07 ENCOUNTER — Encounter (HOSPITAL_COMMUNITY): Payer: Self-pay | Admitting: Anesthesiology

## 2012-08-07 ENCOUNTER — Inpatient Hospital Stay (HOSPITAL_COMMUNITY)
Admission: RE | Admit: 2012-08-07 | Discharge: 2012-08-08 | DRG: 473 | Disposition: A | Discharge status: Home / Self Care | Payer: Medicaid Other | Source: Ambulatory Visit | Attending: Orthopaedic Surgery | Admitting: Orthopaedic Surgery

## 2012-08-07 ENCOUNTER — Encounter (HOSPITAL_COMMUNITY): Payer: Self-pay | Admitting: *Deleted

## 2012-08-07 ENCOUNTER — Encounter (HOSPITAL_COMMUNITY)
Admission: RE | Disposition: A | Discharge status: Home / Self Care | Payer: Self-pay | Source: Ambulatory Visit | Attending: Orthopaedic Surgery

## 2012-08-07 ENCOUNTER — Ambulatory Visit (HOSPITAL_COMMUNITY): Payer: Medicaid Other

## 2012-08-07 DIAGNOSIS — M4802 Spinal stenosis, cervical region: Secondary | ICD-10-CM

## 2012-08-07 DIAGNOSIS — M5020 Other cervical disc displacement, unspecified cervical region: Principal | ICD-10-CM | POA: Diagnosis present

## 2012-08-07 DIAGNOSIS — M47812 Spondylosis without myelopathy or radiculopathy, cervical region: Secondary | ICD-10-CM | POA: Diagnosis present

## 2012-08-07 DIAGNOSIS — A5901 Trichomonal vulvovaginitis: Secondary | ICD-10-CM | POA: Diagnosis present

## 2012-08-07 DIAGNOSIS — Z87891 Personal history of nicotine dependence: Secondary | ICD-10-CM

## 2012-08-07 DIAGNOSIS — A599 Trichomoniasis, unspecified: Secondary | ICD-10-CM | POA: Diagnosis present

## 2012-08-07 HISTORY — PX: ANTERIOR CERVICAL DECOMP/DISCECTOMY FUSION: SHX1161

## 2012-08-07 LAB — URINALYSIS, ROUTINE W REFLEX MICROSCOPIC
Bilirubin Urine: NEGATIVE
Nitrite: NEGATIVE
Specific Gravity, Urine: 1.015 (ref 1.005–1.030)
Urobilinogen, UA: 1 mg/dL (ref 0.0–1.0)

## 2012-08-07 LAB — URINE MICROSCOPIC-ADD ON

## 2012-08-07 LAB — PROTIME-INR
INR: 1.07 (ref 0.00–1.49)
Prothrombin Time: 13.8 seconds (ref 11.6–15.2)

## 2012-08-07 SURGERY — ANTERIOR CERVICAL DECOMPRESSION/DISCECTOMY FUSION 2 LEVELS
Anesthesia: General | Site: Neck | Wound class: Clean

## 2012-08-07 MED ORDER — HYDROMORPHONE HCL PF 1 MG/ML IJ SOLN: 0.2500 mg | INTRAMUSCULAR | Status: DC | PRN

## 2012-08-07 MED ORDER — NEOSTIGMINE METHYLSULFATE 1 MG/ML IJ SOLN
INTRAMUSCULAR | Status: DC | PRN
  Administered 2012-08-07: 3 mg via INTRAVENOUS

## 2012-08-07 MED ORDER — ONDANSETRON HCL 4 MG/2ML IJ SOLN: 4.0000 mg | Freq: Four times a day (QID) | INTRAMUSCULAR | Status: DC | PRN

## 2012-08-07 MED ORDER — KETOROLAC TROMETHAMINE 30 MG/ML IJ SOLN
INTRAMUSCULAR | Status: AC
  Filled 2012-08-07: qty 1

## 2012-08-07 MED ORDER — CEFAZOLIN SODIUM 1-5 GM-% IV SOLN
1.0000 g | Freq: Three times a day (TID) | INTRAVENOUS | Status: AC
  Administered 2012-08-07 – 2012-08-08 (×2): 1 g via INTRAVENOUS
  Filled 2012-08-07 (×3): qty 50

## 2012-08-07 MED ORDER — ACETAMINOPHEN 325 MG PO TABS: 650.0000 mg | ORAL_TABLET | ORAL | Status: DC | PRN

## 2012-08-07 MED ORDER — BUPIVACAINE-EPINEPHRINE PF 0.25-1:200000 % IJ SOLN
INTRAMUSCULAR | Status: AC
  Filled 2012-08-07: qty 30

## 2012-08-07 MED ORDER — LISINOPRIL 20 MG PO TABS
20.0000 mg | ORAL_TABLET | Freq: Every day | ORAL | Status: DC
  Administered 2012-08-08: 20 mg via ORAL
  Filled 2012-08-07: qty 1

## 2012-08-07 MED ORDER — ZOLPIDEM TARTRATE 5 MG PO TABS: 5.0000 mg | ORAL_TABLET | Freq: Every evening | ORAL | Status: DC | PRN

## 2012-08-07 MED ORDER — BISACODYL 10 MG RE SUPP: 10.0000 mg | Freq: Every day | RECTAL | Status: DC | PRN

## 2012-08-07 MED ORDER — HYDRALAZINE HCL 20 MG/ML IJ SOLN
INTRAMUSCULAR | Status: DC | PRN
  Administered 2012-08-07 (×4): 5 mg via INTRAVENOUS

## 2012-08-07 MED ORDER — PHENOL 1.4 % MT LIQD: 1.0000 | OROMUCOSAL | Status: DC | PRN

## 2012-08-07 MED ORDER — SODIUM CHLORIDE 0.9 % IJ SOLN: 3.0000 mL | INTRAMUSCULAR | Status: DC | PRN

## 2012-08-07 MED ORDER — MORPHINE SULFATE 2 MG/ML IJ SOLN: 1.0000 mg | INTRAMUSCULAR | Status: DC | PRN

## 2012-08-07 MED ORDER — SODIUM CHLORIDE 0.9 % IJ SOLN: 3.0000 mL | Freq: Two times a day (BID) | INTRAMUSCULAR | Status: DC

## 2012-08-07 MED ORDER — ONDANSETRON HCL 4 MG/2ML IJ SOLN
INTRAMUSCULAR | Status: DC | PRN
  Administered 2012-08-07: 4 mg via INTRAVENOUS

## 2012-08-07 MED ORDER — DOCUSATE SODIUM 100 MG PO CAPS
100.0000 mg | ORAL_CAPSULE | Freq: Two times a day (BID) | ORAL | Status: DC
  Administered 2012-08-07 – 2012-08-08 (×2): 100 mg via ORAL
  Filled 2012-08-07 (×2): qty 1

## 2012-08-07 MED ORDER — FENTANYL CITRATE 0.05 MG/ML IJ SOLN: 50.0000 ug | Freq: Once | INTRAMUSCULAR | Status: DC

## 2012-08-07 MED ORDER — MENTHOL 3 MG MT LOZG: 1.0000 | LOZENGE | OROMUCOSAL | Status: DC | PRN

## 2012-08-07 MED ORDER — GLYCOPYRROLATE 0.2 MG/ML IJ SOLN
INTRAMUSCULAR | Status: DC | PRN
  Administered 2012-08-07: 0.4 mg via INTRAVENOUS

## 2012-08-07 MED ORDER — LIDOCAINE HCL (CARDIAC) 20 MG/ML IV SOLN
INTRAVENOUS | Status: DC | PRN
  Administered 2012-08-07: 70 mg via INTRAVENOUS

## 2012-08-07 MED ORDER — ACETAMINOPHEN 650 MG RE SUPP: 650.0000 mg | RECTAL | Status: DC | PRN

## 2012-08-07 MED ORDER — BUPIVACAINE-EPINEPHRINE 0.25% -1:200000 IJ SOLN
INTRAMUSCULAR | Status: DC | PRN
  Administered 2012-08-07: 6 mL

## 2012-08-07 MED ORDER — ROCURONIUM BROMIDE 100 MG/10ML IV SOLN
INTRAVENOUS | Status: DC | PRN
  Administered 2012-08-07: 50 mg via INTRAVENOUS

## 2012-08-07 MED ORDER — 0.9 % SODIUM CHLORIDE (POUR BTL) OPTIME
TOPICAL | Status: DC | PRN
  Administered 2012-08-07: 1000 mL

## 2012-08-07 MED ORDER — PROMETHAZINE HCL 25 MG/ML IJ SOLN
12.5000 mg | Freq: Four times a day (QID) | INTRAMUSCULAR | Status: DC | PRN
  Administered 2012-08-07: 12.5 mg via INTRAVENOUS
  Filled 2012-08-07: qty 1

## 2012-08-07 MED ORDER — KETOROLAC TROMETHAMINE 30 MG/ML IJ SOLN
30.0000 mg | Freq: Once | INTRAMUSCULAR | Status: AC
  Administered 2012-08-07: 30 mg via INTRAVENOUS

## 2012-08-07 MED ORDER — OXYCODONE HCL 5 MG/5ML PO SOLN: 5.0000 mg | Freq: Once | ORAL | Status: DC | PRN

## 2012-08-07 MED ORDER — SODIUM CHLORIDE 0.9 % IV SOLN: 250.0000 mL | INTRAVENOUS | Status: DC

## 2012-08-07 MED ORDER — FENTANYL CITRATE 0.05 MG/ML IJ SOLN
INTRAMUSCULAR | Status: DC | PRN
  Administered 2012-08-07 (×2): 100 ug via INTRAVENOUS
  Administered 2012-08-07 (×2): 50 ug via INTRAVENOUS
  Administered 2012-08-07 (×2): 100 ug via INTRAVENOUS

## 2012-08-07 MED ORDER — PROPOFOL 10 MG/ML IV BOLUS
INTRAVENOUS | Status: DC | PRN
  Administered 2012-08-07: 150 mg via INTRAVENOUS

## 2012-08-07 MED ORDER — HYDROCHLOROTHIAZIDE 12.5 MG PO CAPS
12.5000 mg | ORAL_CAPSULE | Freq: Every day | ORAL | Status: DC
  Administered 2012-08-08: 12.5 mg via ORAL
  Filled 2012-08-07: qty 1

## 2012-08-07 MED ORDER — KCL IN DEXTROSE-NACL 20-5-0.45 MEQ/L-%-% IV SOLN
INTRAVENOUS | Status: AC
  Filled 2012-08-07: qty 1000

## 2012-08-07 MED ORDER — LACTATED RINGERS IV SOLN
INTRAVENOUS | Status: DC
  Administered 2012-08-07: 12:00:00 via INTRAVENOUS

## 2012-08-07 MED ORDER — PROMETHAZINE HCL 25 MG/ML IJ SOLN: 6.2500 mg | INTRAMUSCULAR | Status: DC | PRN

## 2012-08-07 MED ORDER — HYDROCHLOROTHIAZIDE 25 MG PO TABS: 25.0000 mg | ORAL_TABLET | Freq: Every day | ORAL | Status: DC

## 2012-08-07 MED ORDER — OXYCODONE HCL 5 MG PO TABS: 5.0000 mg | ORAL_TABLET | Freq: Once | ORAL | Status: DC | PRN

## 2012-08-07 MED ORDER — METHOCARBAMOL 100 MG/ML IJ SOLN
500.0000 mg | Freq: Four times a day (QID) | INTRAVENOUS | Status: DC | PRN
  Filled 2012-08-07: qty 5

## 2012-08-07 MED ORDER — HYDROCHLOROTHIAZIDE 25 MG PO TABS
25.0000 mg | ORAL_TABLET | Freq: Once | ORAL | Status: AC
  Administered 2012-08-07: 25 mg via ORAL
  Filled 2012-08-07 (×2): qty 1

## 2012-08-07 MED ORDER — SENNOSIDES-DOCUSATE SODIUM 8.6-50 MG PO TABS: 1.0000 | ORAL_TABLET | Freq: Every evening | ORAL | Status: DC | PRN

## 2012-08-07 MED ORDER — ONDANSETRON HCL 4 MG/2ML IJ SOLN
4.0000 mg | INTRAMUSCULAR | Status: DC | PRN
  Administered 2012-08-07: 4 mg via INTRAVENOUS
  Filled 2012-08-07: qty 2

## 2012-08-07 MED ORDER — MIDAZOLAM HCL 2 MG/2ML IJ SOLN: 1.0000 mg | INTRAMUSCULAR | Status: DC | PRN

## 2012-08-07 MED ORDER — LISINOPRIL-HYDROCHLOROTHIAZIDE 20-12.5 MG PO TABS: 1.0000 | ORAL_TABLET | Freq: Every day | ORAL | Status: DC

## 2012-08-07 MED ORDER — PANTOPRAZOLE SODIUM 40 MG IV SOLR
40.0000 mg | Freq: Every day | INTRAVENOUS | Status: DC
  Administered 2012-08-07: 40 mg via INTRAVENOUS
  Filled 2012-08-07 (×3): qty 40

## 2012-08-07 MED ORDER — METRONIDAZOLE 500 MG PO TABS
500.0000 mg | ORAL_TABLET | Freq: Two times a day (BID) | ORAL | Status: DC
  Administered 2012-08-07 – 2012-08-08 (×2): 500 mg via ORAL
  Filled 2012-08-07 (×3): qty 1

## 2012-08-07 MED ORDER — METHOCARBAMOL 500 MG PO TABS: 500.0000 mg | ORAL_TABLET | Freq: Four times a day (QID) | ORAL | Status: DC | PRN

## 2012-08-07 MED ORDER — FLEET ENEMA 7-19 GM/118ML RE ENEM: 1.0000 | ENEMA | Freq: Once | RECTAL | Status: AC | PRN

## 2012-08-07 MED ORDER — HYDROCODONE-ACETAMINOPHEN 5-325 MG PO TABS
1.0000 | ORAL_TABLET | ORAL | Status: DC | PRN
  Administered 2012-08-08: 2 via ORAL
  Filled 2012-08-07: qty 2

## 2012-08-07 MED ORDER — LACTATED RINGERS IV SOLN
INTRAVENOUS | Status: DC | PRN
  Administered 2012-08-07 (×2): via INTRAVENOUS

## 2012-08-07 MED ORDER — WHITE PETROLATUM GEL
Status: AC
  Administered 2012-08-07: 20:00:00
  Filled 2012-08-07: qty 5

## 2012-08-07 MED ORDER — OXYCODONE-ACETAMINOPHEN 5-325 MG PO TABS
1.0000 | ORAL_TABLET | ORAL | Status: DC | PRN
  Administered 2012-08-08: 2 via ORAL
  Filled 2012-08-07: qty 2

## 2012-08-07 MED ORDER — LABETALOL HCL 5 MG/ML IV SOLN
INTRAVENOUS | Status: DC | PRN
  Administered 2012-08-07 (×4): 10 mg via INTRAVENOUS

## 2012-08-07 MED ORDER — KCL IN DEXTROSE-NACL 20-5-0.45 MEQ/L-%-% IV SOLN
INTRAVENOUS | Status: DC
  Administered 2012-08-07: 20:00:00 via INTRAVENOUS
  Administered 2012-08-08: 1000 mL via INTRAVENOUS
  Filled 2012-08-07 (×4): qty 1000

## 2012-08-07 MED ORDER — MIDAZOLAM HCL 5 MG/5ML IJ SOLN
INTRAMUSCULAR | Status: DC | PRN
  Administered 2012-08-07: 2 mg via INTRAVENOUS

## 2012-08-07 SURGICAL SUPPLY — 61 items
ADH SKN CLS APL DERMABOND .7 (GAUZE/BANDAGES/DRESSINGS) ×1
APL SKNCLS STERI-STRIP NONHPOA (GAUZE/BANDAGES/DRESSINGS) ×1
BENZOIN TINCTURE PRP APPL 2/3 (GAUZE/BANDAGES/DRESSINGS) ×2 IMPLANT
BIT DRILL SKYLINE 12MM (BIT) IMPLANT
BLADE SURG ROTATE 9660 (MISCELLANEOUS) IMPLANT
BONE CERV LORDOTIC 14.5X12X6 (Bone Implant) ×2 IMPLANT
BONE CERV LORDOTIC 14.5X12X7 (Bone Implant) ×2 IMPLANT
BUR ROUND FLUTED 4 SOFT TCH (BURR) ×1 IMPLANT
CANISTER SUCTION 2500CC (MISCELLANEOUS) ×2 IMPLANT
CLOTH BEACON ORANGE TIMEOUT ST (SAFETY) ×2 IMPLANT
CLSR STERI-STRIP ANTIMIC 1/2X4 (GAUZE/BANDAGES/DRESSINGS) ×1 IMPLANT
COLLAR CERV LO CONTOUR FIRM DE (SOFTGOODS) ×2 IMPLANT
CORDS BIPOLAR (ELECTRODE) IMPLANT
COVER SURGICAL LIGHT HANDLE (MISCELLANEOUS) ×2 IMPLANT
DERMABOND ADVANCED (GAUZE/BANDAGES/DRESSINGS) ×1
DERMABOND ADVANCED .7 DNX12 (GAUZE/BANDAGES/DRESSINGS) ×1 IMPLANT
DRAPE C-ARM 42X72 X-RAY (DRAPES) ×2 IMPLANT
DRAPE MICROSCOPE LEICA (MISCELLANEOUS) ×2 IMPLANT
DRAPE PROXIMA HALF (DRAPES) ×2 IMPLANT
DRILL BIT SKYLINE 12MM (BIT) ×2
DRSG MEPILEX BORDER 4X4 (GAUZE/BANDAGES/DRESSINGS) ×2 IMPLANT
DRSG MEPILEX BORDER 4X8 (GAUZE/BANDAGES/DRESSINGS) ×2 IMPLANT
DURAPREP 6ML APPLICATOR 50/CS (WOUND CARE) ×2 IMPLANT
ELECT COATED BLADE 2.86 ST (ELECTRODE) ×2 IMPLANT
ELECT REM PT RETURN 9FT ADLT (ELECTROSURGICAL) ×2
ELECTRODE REM PT RTRN 9FT ADLT (ELECTROSURGICAL) ×1 IMPLANT
EVACUATOR 1/8 PVC DRAIN (DRAIN) ×2 IMPLANT
GAUZE XEROFORM 1X8 LF (GAUZE/BANDAGES/DRESSINGS) ×2 IMPLANT
GLOVE BIOGEL PI IND STRL 7.5 (GLOVE) ×1 IMPLANT
GLOVE BIOGEL PI IND STRL 8 (GLOVE) ×1 IMPLANT
GLOVE BIOGEL PI INDICATOR 7.5 (GLOVE) ×1
GLOVE BIOGEL PI INDICATOR 8 (GLOVE) ×1
GLOVE ECLIPSE 7.0 STRL STRAW (GLOVE) ×2 IMPLANT
GLOVE ORTHO TXT STRL SZ7.5 (GLOVE) ×2 IMPLANT
GOWN PREVENTION PLUS LG XLONG (DISPOSABLE) IMPLANT
GOWN PREVENTION PLUS XLARGE (GOWN DISPOSABLE) ×2 IMPLANT
GOWN STRL NON-REIN LRG LVL3 (GOWN DISPOSABLE) ×4 IMPLANT
GRAFT BNE SPCR VG2 14.5X12X7 (Bone Implant) IMPLANT
HEAD HALTER (SOFTGOODS) ×2 IMPLANT
HEMOSTAT SURGICEL 2X14 (HEMOSTASIS) IMPLANT
KIT BASIN OR (CUSTOM PROCEDURE TRAY) ×2 IMPLANT
KIT ROOM TURNOVER OR (KITS) ×2 IMPLANT
MANIFOLD NEPTUNE II (INSTRUMENTS) IMPLANT
NDL 25GX 5/8IN NON SAFETY (NEEDLE) ×1 IMPLANT
NEEDLE 25GX 5/8IN NON SAFETY (NEEDLE) ×2 IMPLANT
NS IRRIG 1000ML POUR BTL (IV SOLUTION) ×2 IMPLANT
PACK ORTHO CERVICAL (CUSTOM PROCEDURE TRAY) ×2 IMPLANT
PAD ARMBOARD 7.5X6 YLW CONV (MISCELLANEOUS) ×4 IMPLANT
PATTIES SURGICAL .5 X.5 (GAUZE/BANDAGES/DRESSINGS) IMPLANT
PIN TEMP SKYLINE THREADED (PIN) ×1 IMPLANT
PLATE TWO LEVEL SKYLINE 30MM (Plate) ×1 IMPLANT
SCREW VAR SELF TAP SKYLINE 14M (Screw) ×6 IMPLANT
SPONGE GAUZE 4X4 12PLY (GAUZE/BANDAGES/DRESSINGS) ×2 IMPLANT
SURGIFLO TRUKIT (HEMOSTASIS) IMPLANT
SUT VIC AB 3-0 X1 27 (SUTURE) ×2 IMPLANT
SUT VICRYL 4-0 PS2 18IN ABS (SUTURE) ×4 IMPLANT
SYR 30ML SLIP (SYRINGE) ×2 IMPLANT
TAPE CLOTH SURG 4X10 WHT LF (GAUZE/BANDAGES/DRESSINGS) ×2 IMPLANT
TOWEL OR 17X24 6PK STRL BLUE (TOWEL DISPOSABLE) ×2 IMPLANT
TOWEL OR 17X26 10 PK STRL BLUE (TOWEL DISPOSABLE) ×2 IMPLANT
WATER STERILE IRR 1000ML POUR (IV SOLUTION) ×1 IMPLANT

## 2012-08-07 NOTE — Anesthesia Postprocedure Evaluation (Signed)
  Anesthesia Post-op Note  Patient: Debbie Clarke  Procedure(s) Performed: Procedure(s) with comments: ANTERIOR CERVICAL DECOMPRESSION/DISCECTOMY FUSION 2 LEVELS (N/A) - C5-6, C6-7 ACD&F, ALLOGRAFT AND PLATES  Patient Location: PACU  Anesthesia Type:General  Level of Consciousness: awake  Airway and Oxygen Therapy: Patient Spontanous Breathing  Post-op Pain: mild  Post-op Assessment: Post-op Vital signs reviewed, Patient's Cardiovascular Status Stable, Respiratory Function Stable, Patent Airway, No signs of Nausea or vomiting and Pain level controlled  Post-op Vital Signs: stable  Complications: No apparent anesthesia complications

## 2012-08-07 NOTE — Interval H&P Note (Signed)
History and Physical Interval Note:  08/07/2012 12:30 PM  Debbie Clarke  has presented today for surgery, with the diagnosis of C5-6, C6-7 Spondylosis, HNP  The various methods of treatment have been discussed with the patient and family. After consideration of risks, benefits and other options for treatment, the patient has consented to  Procedure(s) with comments: ANTERIOR CERVICAL DECOMPRESSION/DISCECTOMY FUSION 2 LEVELS (N/A) - C5-6, C6-7 ACD&F, ALLOGRAFT AND PLATES as a surgical intervention .  The patient's history has been reviewed, patient examined, no change in status, stable for surgery.  I have reviewed the patient's chart and labs.  Questions were answered to the patient's satisfaction.     [name] C

## 2012-08-07 NOTE — Anesthesia Preprocedure Evaluation (Addendum)
Anesthesia Evaluation  Patient identified by MRN, date of birth, ID band Patient awake    Reviewed: Allergy & Precautions, H&P , NPO status , Patient's Chart, lab work & pertinent test results  Airway Mallampati: II  Neck ROM: full    Dental   Pulmonary former smoker,  breath sounds clear to auscultation        Cardiovascular hypertension, Rhythm:Regular Rate:Normal     Neuro/Psych    GI/Hepatic GERD-  ,  Endo/Other    Renal/GU      Musculoskeletal   Abdominal   Peds  Hematology  (+) Blood dyscrasia, Sickle cell trait ,   Anesthesia Other Findings   Reproductive/Obstetrics                          Anesthesia Physical Anesthesia Plan  ASA: II  Anesthesia Plan: General   Post-op Pain Management:    Induction: Intravenous  Airway Management Planned: Oral ETT  Additional Equipment:   Intra-op Plan:   Post-operative Plan: Extubation in OR  Informed Consent: I have reviewed the patients History and Physical, chart, labs and discussed the procedure including the risks, benefits and alternatives for the proposed anesthesia with the patient or authorized representative who has indicated his/her understanding and acceptance.     Plan Discussed with: CRNA and Surgeon  Anesthesia Plan Comments:        Anesthesia Quick Evaluation

## 2012-08-07 NOTE — Op Note (Signed)
NAMEKEEANNA, Debbie Clarke              ACCOUNT NO.:  [id number]  MEDICAL RECORD NO.:  [id number]  LOCATION:  5N25C                        FACILITY:  [facility]  PHYSICIAN:  Leyan Branden C. Ophelia Charter, M.D.    DATE OF BIRTH:  [birth year]  DATE OF PROCEDURE:  08/07/2012 DATE OF DISCHARGE:                              OPERATIVE REPORT   PREOPERATIVE DIAGNOSIS:  C6-7 left herniated nucleus pulposus with cord flattening and radiculopathy, C5-6 spondylosis.  POSTOPERATIVE DIAGNOSIS:  C6-7 left herniated nucleus pulposus with cord flattening and radiculopathy, C5-6 spondylosis.  PROCEDURE:  C5-6, C6-7 anterior cervical diskectomy and fusion, allograft and plate.  A 6 mm graft at C5-6, 7 mm at C6-7.  DePuy locking plate, 12 mm cortical screws.  SURGEON:  Claus Silvestro C. Ophelia Charter, MD  ASSISTANT:  Maud Deed, PA-C, medically necessary and present for the entire procedure.  EBL:  Less than 50 mL.  DRAINS:  One Hemovac neck.  This 52 year old female has had progressive several month history of neck, left arm pain, weakness, progressive with HNP, large with cord flattening, and nerve root compression on the left.  She had intermittent problems with neck pain prior to the onset several months ago.  DESCRIPTION OF PROCEDURE:  After induction of general anesthesia, orotracheal intubation, head halter traction was applied without weight. The yellow donut was used for the head.  Neck was prepped with DuraPrep. The area was squared with towels.  Betadine Steri-Drape applied after sterile skin marker for the planned skin crease incision starting at the midline extending to the left.  Sterile Mayo stand at the head and thyroid sheet.  Time-out procedure was completed.  Incision started at the midline, extended to the left.  The platysma was split in line with the fibers.  Blunt dissection inferior to the omohyoid was performed on longus coli.  There was large 4 mm vein which was gently taken laterally.  Large spurs were  noted at C5-6, C6-7 corresponding with preoperative studies.  MRI scan was displayed on the large screens in the operating room.  Needle was placed C5-6, confirmed with lateral cross-table C-arm spot after draping the C-arm.  C6-7 was surgically approached first since this was the level which had some significant compression.  Spurs removed anteriorly, self-retaining retractors were placed with Cloward right and left, smooth blades up and down. Diskectomy was performed progressing down the posterior and longitudinal ligament.  There were extruded fragments present which were removed but they were retained by portion of the ligament and were enveloped in a pocket that had small capsule around it.  Capsules excised, dura was completely decompressed all the way across from right to left. Uncovertebral joints were stripped using microscope angling both right and left into the corners to remove spurs for decompression.  Dura was round, compression was relieved and disk fragments were left paracentral and extended slightly cephalad and were able to be pulled out with complete decompression.  Sizing showed 7 mm gave a tight fit.  Little bit of the endplate at C6 have been taken since portion of the disk fragment was cephalad and I wanted to make sure that I got complete decompression to relieve for nerve root compression.  Traction applied  by the CRNA.  Graft was inserted with hammer countersunk 1 mm.  Attention was left off, graft was tight.  Identical procedure was repeated at C5-6 at this level.  Disk space was tighter. There was some shingling with overhanging of the C5 endplate spurs over C6.  These were removed, taken down to complete decompression of the dura and removal of the posterior and longitudinal ligament which was thickened and partially calcified in the midline.  Sizing showed a 6, gave a tight fit.  It was inserted in the midline after marking the graft and then appropriate  plate was selected.  All 6 holes were filled, checked under fluoroscopy AP, lateral, and oblique.  I irrigated and then standard layered closure.  Finger tip was placed down anterior to C7-T1 in the mediastinum for any aggressive fluid.  Hemovac was placed in and out technique in line with the skin incision.  Platysma closed with 3-0, 4-0 Vicryl used through subcuticular skin closure, tincture of benzoin, Steri-Strips, 4x4s, tape, and soft cervical collar.     Debbie Clarke C. Ophelia Charter, M.D.     MCY/MEDQ  D:  08/07/2012  T:  08/07/2012  Job:  [id number]

## 2012-08-07 NOTE — Brief Op Note (Cosign Needed)
08/07/2012  2:56 PM  PATIENT:  Debbie Clarke  52 y.o. female  PRE-OPERATIVE DIAGNOSIS:  C5-6, C6-7 Spondylosis, HNP  POST-OPERATIVE DIAGNOSIS:  C5-6, C6-7 Spondylosis, HNP  PROCEDURE:  Procedure(s) with comments: ANTERIOR CERVICAL DECOMPRESSION/DISCECTOMY FUSION 2 LEVELS (N/A) - C5-6, C6-7 ACD&F, ALLOGRAFT AND PLATES  SURGEON:  Surgeon(s) and Role:    * Eldred Manges, MD - Primary  PHYSICIAN ASSISTANT: Maud Deed PAC  ASSISTANTS: none   ANESTHESIA:   general  EBL:  Total I/O In: 1000 [I.V.:1000] Out: -   BLOOD ADMINISTERED:none  DRAINS: (1) Hemovact drain(s) in the anterior neck with  Suction Open   LOCAL MEDICATIONS USED:  MARCAINE     SPECIMEN:  No Specimen  DISPOSITION OF SPECIMEN:  N/A  COUNTS:  YES  TOURNIQUET:  * No tourniquets in log *  DICTATION: .Note written in EPIC  PLAN OF CARE: Admit to inpatient   PATIENT DISPOSITION:  PACU - hemodynamically stable.   Delay start of Pharmacological VTE agent (>24hrs) due to surgical blood loss or risk of bleeding: yes

## 2012-08-07 NOTE — Transfer of Care (Signed)
Immediate Anesthesia Transfer of Care Note  Patient: Debbie Clarke  Procedure(s) Performed: Procedure(s) with comments: ANTERIOR CERVICAL DECOMPRESSION/DISCECTOMY FUSION 2 LEVELS (N/A) - C5-6, C6-7 ACD&F, ALLOGRAFT AND PLATES  Patient Location: PACU  Anesthesia Type:General  Level of Consciousness: awake, alert , oriented and patient cooperative  Airway & Oxygen Therapy: Patient Spontanous Breathing and Patient connected to nasal cannula oxygen  Post-op Assessment: Report given to PACU RN, Post -op Vital signs reviewed and stable and Patient moving all extremities X 4  Post vital signs: Reviewed and stable  Complications: No apparent anesthesia complications

## 2012-08-08 DIAGNOSIS — A5901 Trichomonal vulvovaginitis: Secondary | ICD-10-CM | POA: Diagnosis present

## 2012-08-08 DIAGNOSIS — M4802 Spinal stenosis, cervical region: Secondary | ICD-10-CM | POA: Diagnosis present

## 2012-08-08 MED ORDER — METHOCARBAMOL 500 MG PO TABS: 500.0000 mg | ORAL_TABLET | Freq: Four times a day (QID) | ORAL | Status: DC | PRN

## 2012-08-08 MED ORDER — METRONIDAZOLE 500 MG PO TABS: 500.0000 mg | ORAL_TABLET | Freq: Two times a day (BID) | ORAL | Status: DC

## 2012-08-08 MED ORDER — OXYCODONE-ACETAMINOPHEN 5-325 MG PO TABS: 1.0000 | ORAL_TABLET | ORAL | Status: DC | PRN

## 2012-08-08 NOTE — Progress Notes (Signed)
Patient discharge to home with family at 38.  Discharge instructions provided by this RN.  Patient verbalize understanding, no further question ask.  Given 2 percocet at 1440 for pain prior to discharge.  Escort off unit by volunteer.

## 2012-08-08 NOTE — Progress Notes (Signed)
Subjective: 1 Day Post-Op Procedure(s) (LRB): ANTERIOR CERVICAL DECOMPRESSION/DISCECTOMY FUSION 2 LEVELS (N/A) Patient reports pain as mild.   States left arm feels weak, but not weaker than pre op Has been out of bed multiple times for bathroom Daughter will take care of her at home. Objective: Vital signs in last 24 hours: Temp:  [97.1 F (36.2 C)-98.5 F (36.9 C)] 98.2 F (36.8 C) (06/24 0647) Pulse Rate:  [46-69] 61 (06/24 0647) Resp:  [10-22] 18 (06/24 0647) BP: (136-179)/(70-93) 159/78 mmHg (06/24 0647) SpO2:  [96 %-100 %] 99 % (06/24 0647) Weight:  [63.549 kg (140 lb 1.6 oz)] 63.549 kg (140 lb 1.6 oz) (06/23 1719)  Intake/Output from previous day: 06/23 0701 - 06/24 0700 In: 2285 [I.V.:2285] Out: 160 [Drains:60; Blood:100] Intake/Output this shift:    No results found for this basename: HGB,  in the last 72 hours No results found for this basename: WBC, RBC, HCT, PLT,  in the last 72 hours No results found for this basename: NA, K, CL, CO2, BUN, CREATININE, GLUCOSE, CALCIUM,  in the last 72 hours  Recent Labs  08/07/12 1106  INR 1.07    Neurovascular intact Incision: dressing C/D/I and drain removed without difficulty  Assessment/Plan: 1 Day Post-Op Procedure(s) (LRB): ANTERIOR CERVICAL DECOMPRESSION/DISCECTOMY FUSION 2 LEVELS (N/A) D/C IV fluids D/C home today.  Walk in hallway today OV 2 weeks rx percocet and robaxin Flagyl for trichomoniasis:  Discussed with pt to FU with GYN Collar at all times Dressing change prior to dc  Briteny Fulghum M 08/08/2012, 10:37 AM

## 2012-08-08 NOTE — Progress Notes (Signed)
UR COMPLETED  

## 2012-08-09 ENCOUNTER — Encounter (HOSPITAL_COMMUNITY): Payer: Self-pay | Admitting: Orthopaedic Surgery

## 2012-08-09 LAB — URINE CULTURE: Colony Count: 45000

## 2012-08-11 NOTE — Discharge Summary (Signed)
Physician Discharge Summary  Patient ID: Debbie Clarke MRN: [id number] DOB/AGE: [birth year] 52 y.o.  Admit date: 08/07/2012 Discharge date: 08/11/2012  Admission Diagnoses:  Spinal stenosis in cervical region Herniated nucleus pulposis with cord flattening and radiculopathy left C6-7  Spondylosis C5-6  Discharge Diagnoses:  Principal Problem:   Spinal stenosis in cervical region Active Problems:   Trichomoniasis of vagina Herniated nucleus pulposis with cord flattening and radiculopathy left C6-7  Spondylosis C5-6  Past Medical History  Diagnosis Date  . Hypertension   . GERD (gastroesophageal reflux disease)   . Blood dyscrasia     sickle cell trait    Surgeries: Procedure(s): ANTERIOR CERVICAL DECOMPRESSION/DISCECTOMY FUSION 2 LEVELS on 08/07/2012.  C5-6 and C6-7   Consultants (if any):   none  Discharged Condition: Improved  Hospital Course: Debbie Clarke is an 52 y.o. female who was admitted 08/07/2012 with a diagnosis of Spinal stenosis in cervical region and went to the operating room on 08/07/2012 and underwent the above named procedures.    She was given perioperative antibiotics:  Anti-infectives   Start     Dose/Rate Route Frequency Ordered Stop   08/08/12 0000  metroNIDAZOLE (FLAGYL) 500 MG tablet     500 mg Oral Every 12 hours 08/08/12 1044     08/07/12 2200  metroNIDAZOLE (FLAGYL) tablet 500 mg  Status:  Discontinued     500 mg Oral Every 12 hours 08/07/12 1718 08/08/12 1758   08/07/12 2000  ceFAZolin (ANCEF) IVPB 1 g/50 mL premix     1 g 100 mL/hr over 30 Minutes Intravenous Every 8 hours 08/07/12 1718 08/08/12 0503   08/07/12 0600  ceFAZolin (ANCEF) IVPB 2 g/50 mL premix     2 g 100 mL/hr over 30 Minutes Intravenous On call to O.R. 08/06/12 1407 08/07/12 1255    urinalysis with trichomonas and pt treated with flagyl.  She was advised to follow up with her GYN or PCP.  She was given sequential compression devices, early ambulation for DVT  prophylaxis.  She benefited maximally from the hospital stay and there were no complications.    Recent vital signs:  Filed Vitals:   08/08/12 0647  BP: 159/78  Pulse: 61  Temp: 98.2 F (36.8 C)  Resp: 18    Recent laboratory studies:  Lab Results  Component Value Date   HGB 13.9 08/01/2012   HGB 15.1* 07/29/2012   Lab Results  Component Value Date   WBC 4.8 08/01/2012   PLT 196 08/01/2012   Lab Results  Component Value Date   INR 1.07 08/07/2012   Lab Results  Component Value Date   NA 139 08/01/2012   K 3.5 08/01/2012   CL 102 08/01/2012   CO2 29 08/01/2012   BUN 9 08/01/2012   CREATININE 0.67 08/01/2012   GLUCOSE 88 08/01/2012    Discharge Medications:     Medication List    STOP taking these medications       cyclobenzaprine 10 MG tablet  Commonly known as:  FLEXERIL     GOODY BODY PAIN 500-325 MG Pack  Generic drug:  Aspirin-Acetaminophen     HYDROcodone-acetaminophen 5-325 MG per tablet  Commonly known as:  NORCO/VICODIN      TAKE these medications       lisinopril-hydrochlorothiazide 20-12.5 MG per tablet  Commonly known as:  PRINZIDE  Take 1 tablet by mouth daily.     methocarbamol 500 MG tablet  Commonly known as:  ROBAXIN  Take 1 tablet (500 mg  total) by mouth every 6 (six) hours as needed.     metroNIDAZOLE 500 MG tablet  Commonly known as:  FLAGYL  Take 1 tablet (500 mg total) by mouth every 12 (twelve) hours.     oxyCODONE-acetaminophen 5-325 MG per tablet  Commonly known as:  PERCOCET/ROXICET  Take 1-2 tablets by mouth every 4 (four) hours as needed.        Diagnostic Studies: Dg Cervical Spine 2-3 Views  08/07/2012   *RADIOLOGY REPORT*  Clinical Data: Cervical fusion.  CERVICAL SPINE - 2-3 VIEW  Comparison: MRI 06/17/2012.  Findings: Anterior plate screws and interbody bone plugs are fusing C5-6 and C6-7.  Good position and alignment without complicating features.  IMPRESSION: Anterior and interbody fusion at C5-6 and C6-7.   Original  Report Authenticated By: Rudie Meyer, M.D.   Dg C-arm 61-120 Min  08/07/2012   *RADIOLOGY REPORT*  Clinical Data: ACDF C5-6 and C6-7  DG C-ARM 61-120 MIN  Technique:  AP and lateral images of the cervical spine with C-arm.  Comparison:  Cervical MRI 06/17/2012  Findings: The lower cervical spine not well visualized on lateral views due to large shoulders.  ACDF at C5-6 and C6-7 with anterior plate in satisfactory position. Normal alignment and no immediate complication.  IMPRESSION: ACDF C5-6 and C6-7 with suboptimal visualization of the lower cervical spine.   Original Report Authenticated By: Janeece Riggers, M.D.    Disposition: 01-Home or Self Care      Discharge Orders   Future Orders Complete By Expires     Call MD / Call 911  As directed     Comments:      If you experience chest pain or shortness of breath, CALL 911 and be transported to the hospital emergency room.  If you develope a fever above 101 F, pus (white drainage) or increased drainage or redness at the wound, or calf pain, call your surgeon's office.    Constipation Prevention  As directed     Comments:      Drink plenty of fluids.  Prune juice may be helpful.  You may use a stool softener, such as Colace (over the counter) 100 mg twice a day.  Use MiraLax (over the counter) for constipation as needed.    Diet - low sodium heart healthy  As directed     Discharge instructions  As directed     Comments:      No lifting greater than 10 lbs. No overhead use of arms. Avoid bending,and twisting neck. Walk in house for first week them may start to get out slowly increasing distance up to one mile by 3 weeks post op. Keep incision dry for 3 days, may then bathe and wet incision using a covered collar when showering. Call if any fevers >101, chills, or increasing numbness or weakness or increased swelling or drainage.    Increase activity slowly as tolerated  As directed     Lifting restrictions  As directed     Comments:      No  lifting       Follow-up Information   Follow up with YATES,MARK C, MD. Schedule an appointment as soon as possible for a visit in 2 weeks. (or as scheduled.)    Contact information:   [address] [place]bbie Clarke [address] [phone]        Signed: Wende Neighbors 08/11/2012, 10:17 AM

## 2012-08-29 ENCOUNTER — Other Ambulatory Visit (HOSPITAL_COMMUNITY): Payer: Self-pay | Admitting: Family Medicine

## 2012-08-29 DIAGNOSIS — Z1231 Encounter for screening mammogram for malignant neoplasm of breast: Secondary | ICD-10-CM

## 2012-09-07 ENCOUNTER — Ambulatory Visit (HOSPITAL_COMMUNITY)
Admission: RE | Admit: 2012-09-07 | Discharge: 2012-09-07 | Disposition: A | Discharge status: Home / Self Care | Payer: Medicaid Other | Source: Ambulatory Visit | Attending: Family Medicine | Admitting: Family Medicine

## 2012-09-07 DIAGNOSIS — Z1231 Encounter for screening mammogram for malignant neoplasm of breast: Secondary | ICD-10-CM | POA: Insufficient documentation

## 2013-04-12 ENCOUNTER — Ambulatory Visit: Payer: Medicaid Other | Admitting: Family Medicine

## 2013-05-25 ENCOUNTER — Encounter: Payer: Self-pay | Admitting: Family Medicine

## 2013-05-25 ENCOUNTER — Ambulatory Visit (INDEPENDENT_AMBULATORY_CARE_PROVIDER_SITE_OTHER): Payer: Medicaid Other | Admitting: Family Medicine

## 2013-05-25 VITALS — BP 164/93 | HR 77 | Temp 99.2°F | Ht 63.0 in | Wt 163.0 lb

## 2013-05-25 DIAGNOSIS — M545 Low back pain, unspecified: Secondary | ICD-10-CM

## 2013-05-25 DIAGNOSIS — N951 Menopausal and female climacteric states: Secondary | ICD-10-CM

## 2013-05-25 DIAGNOSIS — R232 Flushing: Secondary | ICD-10-CM

## 2013-05-25 DIAGNOSIS — Z1211 Encounter for screening for malignant neoplasm of colon: Secondary | ICD-10-CM

## 2013-05-25 DIAGNOSIS — I10 Essential (primary) hypertension: Secondary | ICD-10-CM

## 2013-05-25 DIAGNOSIS — Z0000 Encounter for general adult medical examination without abnormal findings: Secondary | ICD-10-CM

## 2013-05-25 LAB — COMPLETE METABOLIC PANEL WITH GFR
ALT: 12 U/L (ref 0–35)
AST: 13 U/L (ref 0–37)
Albumin: 4.1 g/dL (ref 3.5–5.2)
Alkaline Phosphatase: 62 U/L (ref 39–117)
BILIRUBIN TOTAL: 0.5 mg/dL (ref 0.2–1.2)
BUN: 8 mg/dL (ref 6–23)
CALCIUM: 9.4 mg/dL (ref 8.4–10.5)
CHLORIDE: 105 meq/L (ref 96–112)
CO2: 28 mEq/L (ref 19–32)
CREATININE: 0.64 mg/dL (ref 0.50–1.10)
GFR, Est Non African American: >89 mL/min
Glucose, Bld: 75 mg/dL (ref 70–99)
Potassium: 3.8 mEq/L (ref 3.5–5.3)
Sodium: 141 mEq/L (ref 135–145)
Total Protein: 7 g/dL (ref 6.0–8.3)

## 2013-05-25 LAB — CBC
HEMATOCRIT: 37.8 % (ref 36.0–46.0)
Hemoglobin: 13.1 g/dL (ref 12.0–15.0)
MCH: 28.2 pg (ref 26.0–34.0)
MCHC: 34.7 g/dL (ref 30.0–36.0)
MCV: 81.3 fL (ref 78.0–100.0)
PLATELETS: 249 10*3/uL (ref 150–400)
RBC: 4.65 MIL/uL (ref 3.87–5.11)
RDW: 15.7 % — AB (ref 11.5–15.5)
WBC: 4.3 10*3/uL (ref 4.0–10.5)

## 2013-05-25 MED ORDER — LOSARTAN POTASSIUM-HCTZ 100-12.5 MG PO TABS: 1.0000 | ORAL_TABLET | Freq: Every day | ORAL | Status: DC

## 2013-05-25 MED ORDER — VENLAFAXINE HCL ER 37.5 MG PO CP24: 37.5000 mg | ORAL_CAPSULE | Freq: Every day | ORAL | Status: DC

## 2013-05-25 NOTE — Patient Instructions (Addendum)
It was nice to see you today.  Below is your current plan:  1) High BP - I am checking labs and starting you on Losartan-HCTZ.  2) Preventative care - I am placing a referral for GI so that you can get your colonoscopy  3) Hot flashes - I am starting you on a medication call Venlafaxine.  4) Back pain and weakness - I am ordering a MRI.  Follow up with me after MRI has been completed.

## 2013-05-26 DIAGNOSIS — M545 Low back pain, unspecified: Secondary | ICD-10-CM | POA: Insufficient documentation

## 2013-05-26 DIAGNOSIS — Z0000 Encounter for general adult medical examination without abnormal findings: Secondary | ICD-10-CM | POA: Insufficient documentation

## 2013-05-26 DIAGNOSIS — R232 Flushing: Secondary | ICD-10-CM | POA: Insufficient documentation

## 2013-05-26 NOTE — Progress Notes (Signed)
   Subjective:    Patient ID: Debbie Clarke, female    DOB: 04-25-1960, 53 y.o.   MRN: [id number]  HPI 53 year old female presents today to establish care.    PMH, Surgical Hx, Social Hx reviewed (see below).  Past Medical History  Diagnosis Date  . Hypertension   . GERD (gastroesophageal reflux disease)   . Blood dyscrasia     sickle cell trait   Past Surgical History  Procedure Laterality Date  . Cesarean section  1985, 1998  . Anterior cervical decomp/discectomy fusion N/A 08/07/2012    Procedure: ANTERIOR CERVICAL DECOMPRESSION/DISCECTOMY FUSION 2 LEVELS;  Surgeon: Marybelle Killings, MD;  Location: [facility];  Service: Orthopedics;  Laterality: N/A;  C5-6, C6-7 ACD&F, ALLOGRAFT AND PLATES   History  Substance Use Topics  . Smoking status: Former Smoker -- 0.25 packs/day for 20 years    Types: Cigarettes    Quit date: 08/02/1998  . Smokeless tobacco: Never Used  . Alcohol Use: No   Current issues/complaints:  1) HTN - Disease Monitoring:  Home BP Monitoring - No - Medications: Patient has not been on any medication for nearly 1 year.   - ROS: Denies chest pain, lightheadedness/dizziness  2) Hot flashes - Patient has been amenorrheic for nearly 1 year - Hot flashes occur regularly, moreso at night.  The lasts for minutes and slowly resolve.  She states that they are quite bothersome. - No reported fever, chills.     3) Back pain - Patient reports longstanding back pain (approximately 1 year) - Pain is located midline in the lower back - She reports associated left LE weakness.  She also reports numbness and tingling on the dorsum of her left foot and the 2 & 3 toes.  - This has been slowly progressing/worsening - No reported incontinence or saddle anesthesia  Review of Systems Per HPI    Objective:   Physical Exam Filed Vitals:   05/25/13 1454  BP: 164/93  Pulse: 77  Temp: 99.2 F (37.3 C)   Exam: General: well appearing female in NAD.  Cardiovascular: RRR. No  murmurs, rubs, or gallops. Respiratory: CTAB. No rales, rhonchi, or wheeze. Extremities: No LE edema. Back: tenderness to palpation, spinous processes of the lumbar spine. Neuro: 2+ patellar and achilles reflexes.  4/5 strength with hip flexion, plantar flexion and dorsiflexion of left leg/foot.  Weakness with walking on heels and walking on tip-toes.       Assessment & Plan:  See Problem List

## 2013-05-26 NOTE — Assessment & Plan Note (Signed)
Patient with reported neurologic findings and weakness on physical exam. Obtaining MRI to assess for underlying nerve impingement/compression.

## 2013-05-26 NOTE — Assessment & Plan Note (Signed)
Uncontrolled. Starting Losartan/HCTZ today.  Also obtaining labs.

## 2013-05-26 NOTE — Assessment & Plan Note (Signed)
Patient menopausal. Will try Venlafaxine and monitor for improvement.

## 2013-05-26 NOTE — Assessment & Plan Note (Signed)
Patient in need of colonoscopy. Will place referral today.

## 2013-05-29 ENCOUNTER — Telehealth: Payer: Self-pay | Admitting: Family Medicine

## 2013-05-29 ENCOUNTER — Other Ambulatory Visit: Payer: Medicaid Other

## 2013-05-29 ENCOUNTER — Other Ambulatory Visit: Payer: Self-pay | Admitting: Family Medicine

## 2013-05-29 ENCOUNTER — Ambulatory Visit
Admission: RE | Admit: 2013-05-29 | Discharge: 2013-05-29 | Disposition: A | Discharge status: Home / Self Care | Payer: Medicaid Other | Source: Ambulatory Visit | Attending: Family Medicine | Admitting: Family Medicine

## 2013-05-29 DIAGNOSIS — M545 Low back pain, unspecified: Secondary | ICD-10-CM

## 2013-05-29 MED ORDER — DIAZEPAM 5 MG PO TABS: ORAL_TABLET | ORAL | Status: DC

## 2013-05-29 NOTE — Telephone Encounter (Signed)
Dear [facility] Please call in THANKS! Wavie Hashimi L Sakoya Win  

## 2013-05-29 NOTE — Telephone Encounter (Signed)
LVM informing patient that I have called in RX

## 2013-05-29 NOTE — Telephone Encounter (Signed)
Pt called because she is scheduled for a MRI this morning, but was unable to complete because she freaked out. They have rescheduled this for 1 pm today and they suggested that she call her PCP to get something that would relax her enough to complete the MRI. jw

## 2013-05-31 ENCOUNTER — Ambulatory Visit
Admission: RE | Admit: 2013-05-31 | Discharge: 2013-05-31 | Disposition: A | Discharge status: Home / Self Care | Payer: Medicaid Other | Source: Ambulatory Visit | Attending: Family Medicine | Admitting: Family Medicine

## 2013-05-31 DIAGNOSIS — M545 Low back pain, unspecified: Secondary | ICD-10-CM

## 2013-06-27 ENCOUNTER — Telehealth: Payer: Self-pay | Admitting: Family Medicine

## 2013-06-27 NOTE — Telephone Encounter (Signed)
Needs something for pain.  Her feet hands back shoulders--her whole body aches Please advise

## 2013-06-27 NOTE — Telephone Encounter (Signed)
Emergency Line / After Hours Call  Because clinic is closed this afternoon, received call on emergency line from patient about flareup of pain. She called earlier this morning requesting pain medication, and is now calling back to again inquire about having pain medicine sent in. Informed her that I cannot do this over the phone without evaluating her myself. Advised that she can wait until Dr. Lacinda Axon sees his phone message from earlier today, also offered that she can go to Urgent Care this afternoon to be seen if she is having this much pain. Pt voiced understanding of these instructions.  Chrisandra Netters, MD Family Medicine PGY-2

## 2013-06-28 MED ORDER — GABAPENTIN 300 MG PO CAPS: 300.0000 mg | ORAL_CAPSULE | Freq: Three times a day (TID) | ORAL | Status: DC

## 2013-06-28 NOTE — Telephone Encounter (Signed)
I have sent in something for pain. She needs to follow up.

## 2013-07-25 ENCOUNTER — Emergency Department (HOSPITAL_COMMUNITY)
Admission: EM | Admit: 2013-07-25 | Discharge: 2013-07-25 | Disposition: A | Discharge status: Home / Self Care | Payer: Medicaid Other | Attending: Emergency Medicine | Admitting: Emergency Medicine

## 2013-07-25 ENCOUNTER — Encounter (HOSPITAL_COMMUNITY): Payer: Self-pay | Admitting: Emergency Medicine

## 2013-07-25 ENCOUNTER — Emergency Department (HOSPITAL_COMMUNITY): Payer: Medicaid Other

## 2013-07-25 DIAGNOSIS — Z79899 Other long term (current) drug therapy: Secondary | ICD-10-CM | POA: Insufficient documentation

## 2013-07-25 DIAGNOSIS — Z87891 Personal history of nicotine dependence: Secondary | ICD-10-CM | POA: Insufficient documentation

## 2013-07-25 DIAGNOSIS — S139XXA Sprain of joints and ligaments of unspecified parts of neck, initial encounter: Secondary | ICD-10-CM | POA: Insufficient documentation

## 2013-07-25 DIAGNOSIS — S161XXA Strain of muscle, fascia and tendon at neck level, initial encounter: Secondary | ICD-10-CM

## 2013-07-25 DIAGNOSIS — I10 Essential (primary) hypertension: Secondary | ICD-10-CM | POA: Insufficient documentation

## 2013-07-25 DIAGNOSIS — Z8719 Personal history of other diseases of the digestive system: Secondary | ICD-10-CM | POA: Insufficient documentation

## 2013-07-25 MED ORDER — HYDROCODONE-ACETAMINOPHEN 5-325 MG PO TABS: ORAL_TABLET | ORAL | Status: DC

## 2013-07-25 MED ORDER — DIAZEPAM 5 MG PO TABS: 5.0000 mg | ORAL_TABLET | Freq: Two times a day (BID) | ORAL | Status: DC

## 2013-07-25 MED ORDER — OXYCODONE-ACETAMINOPHEN 5-325 MG PO TABS
1.0000 | ORAL_TABLET | Freq: Once | ORAL | Status: AC
  Administered 2013-07-25: 1 via ORAL
  Filled 2013-07-25: qty 1

## 2013-07-25 MED ORDER — CYCLOBENZAPRINE HCL 10 MG PO TABS
5.0000 mg | ORAL_TABLET | Freq: Once | ORAL | Status: AC
  Administered 2013-07-25: 5 mg via ORAL
  Filled 2013-07-25: qty 1
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
## 2013-07-25 NOTE — ED Notes (Signed)
Initial Contact - pt resting in chair with family at bedside.  Pt reports PA this AM, c/o "pain everywhere", 10/10.  Pt is A+Ox4.  Ambulatory with steady gait.  Skin PWD. No obvious injuries noted.  NAD.

## 2013-07-26 ENCOUNTER — Ambulatory Visit (INDEPENDENT_AMBULATORY_CARE_PROVIDER_SITE_OTHER): Payer: Medicaid Other | Admitting: Family Medicine

## 2013-07-26 VITALS — BP 178/112 | HR 67 | Temp 97.8°F | Ht 63.0 in | Wt 158.0 lb

## 2013-07-26 DIAGNOSIS — I10 Essential (primary) hypertension: Secondary | ICD-10-CM

## 2013-07-26 DIAGNOSIS — M159 Polyosteoarthritis, unspecified: Secondary | ICD-10-CM

## 2013-07-26 MED ORDER — GABAPENTIN 300 MG PO CAPS: 600.0000 mg | ORAL_CAPSULE | Freq: Three times a day (TID) | ORAL | Status: DC

## 2013-07-26 MED ORDER — TRAMADOL HCL 50 MG PO TABS: 50.0000 mg | ORAL_TABLET | Freq: Three times a day (TID) | ORAL | Status: DC | PRN

## 2013-07-26 MED ORDER — AMLODIPINE BESYLATE 5 MG PO TABS: 5.0000 mg | ORAL_TABLET | Freq: Every day | ORAL | Status: DC

## 2013-07-26 NOTE — Patient Instructions (Addendum)
It was nice to see you today.  Regarding your pain: - Take Tramadol as prescribed. - Take Tylenol 1000 mg three times daily. - Take the Gabapentin as prescribed as well (dose increased). - You can also take Ibuprofen/Aleve as needed (use sparingly).  Regarding your BP - continue your current medication. - I have also added Norvasc.  Take it daily.

## 2013-07-26 NOTE — Progress Notes (Signed)
   Subjective:    Patient ID: Debbie Clarke, female    DOB: 1960/11/30, 53 y.o.   MRN: [id number]  HPI 53 year old female presents today for follow up. Current issues:  1) Pain - Located  primarily in the low back.  She also has left arm/shoulder pain as well as pain in her feet (dorsum).  - Patient has significant OA.  She has had prior cervical fusion.  Recent MRI revealed significant lumbar facet hypertrophy and arthritis. - She reports significant pain in the above sites unrelieved by Gabapentin - She has used Goody powder previously for her pain, but use has dramatically decreased per my recommendations - Pain is currently 10/10 in severity.   - No LE weakness, numbness, tingling.  No reports of incontinence. She does not left hand weakness and states that it has been like this since her cervical fusion.     Review of Systems Per HPI    Objective:   Physical Exam Filed Vitals:   07/26/13 1518  BP: 178/112  Pulse: 67  Temp:    Exam: General: 53 year old female, appears stated age, appears in pain. Cardiovascular: RRR. No murmurs, rubs, or gallops. Respiratory: CTAB. No rales, rhonchi, or wheeze.  Shoulder: Inspection reveals no abnormalities, atrophy or asymmetry. Palpation is normal with no tenderness over AC joint or bicipital groove. ROM normal.  Rotator cuff strength normal throughout. No signs of impingement with negative empty can. No painful arc and no drop arm sign.  Back: No redness, erythema.  Decreased ROM secondary to pain. Extremities: Prominent proximal 1st metatarsal noted.  Neuro: No focal deficits.     Assessment & Plan:  See Problem List

## 2013-07-27 DIAGNOSIS — R52 Pain, unspecified: Secondary | ICD-10-CM | POA: Insufficient documentation

## 2013-07-27 NOTE — Assessment & Plan Note (Signed)
Starting Tramadol today. Also increasing Gabapentin. PRN NSAID's (sparingly). Follow up in 1-3 months or earlier if needed.

## 2013-07-27 NOTE — Assessment & Plan Note (Signed)
BP elevated today.  Added Norvasc in addition to HCTZ/Losartan.

## 2013-07-28 NOTE — ED Provider Notes (Signed)
Medical screening examination/treatment/procedure(s) were performed by non-physician practitioner and as supervising physician I was immediately available for consultation/collaboration.   EKG Interpretation None       Jasper Riling. Alvino Chapel, MD 07/28/13 [phone]

## 2013-08-13 ENCOUNTER — Telehealth: Payer: Self-pay | Admitting: Family Medicine

## 2013-08-13 ENCOUNTER — Encounter: Payer: Self-pay | Admitting: *Deleted

## 2013-08-13 NOTE — Progress Notes (Signed)
Prior Authorization received from [facility] pharmacy for Losartan-HCTZ 100-12.5 mg tablet. Formulary and PA form placed in provider box for completion. Derl Barrow, RN

## 2013-08-13 NOTE — Telephone Encounter (Signed)
Needs refill on losartan. Is concerned about interaction with all her other mental health meds. Is it safe to take them all together? Tramadol, paroxetine, venlafaxine and saphris----can they all be taken together? Please advise

## 2013-08-13 NOTE — Progress Notes (Signed)
PA for Losartan-HCTZ pending per [place] [employer].  Confirmation number [id number] Teresita Madura, Rosine Beat, RN  Received PA approval for Losartan-HTCZ 100-12.5 mg tab via [place] [facility].  Med approved for 08/13/2013 - 08/13/2014.  [facility] informed.  PA confirmation number [id number] Teresita Madura, Rosine Beat, RN

## 2013-08-13 NOTE — Telephone Encounter (Signed)
Prior auth for Losartan filled out today.  She is okay to take meds at same time.

## 2013-08-29 ENCOUNTER — Telehealth: Payer: Self-pay | Admitting: Family Medicine

## 2013-08-29 ENCOUNTER — Other Ambulatory Visit: Payer: Self-pay | Admitting: Family Medicine

## 2013-08-29 DIAGNOSIS — Z1231 Encounter for screening mammogram for malignant neoplasm of breast: Secondary | ICD-10-CM

## 2013-08-29 NOTE — Telephone Encounter (Signed)
Patient states that Tramadol is not helping with her pain. Also, she states she never received RX for gabapentin. Advised patient to check with pharmacy.  Please advise.

## 2013-08-29 NOTE — Telephone Encounter (Signed)
[facility],after speaking with the pharmacy tech patient's RX for Gabapentin  has been filled and since no one picked it up it goes back in stock.She did pick up the tramadol but not the gabapentin .Informed patient ,I explained that [facility] and [facility] has [facility] refills.I  also advised her to take the Gabapentin for a least 3 weeks to see if it helps.she voiced understanding.Debbie Clarke, Lewie Loron

## 2013-08-30 ENCOUNTER — Ambulatory Visit (INDEPENDENT_AMBULATORY_CARE_PROVIDER_SITE_OTHER): Payer: Medicaid Other | Admitting: Family Medicine

## 2013-08-30 VITALS — BP 160/87 | Temp 98.3°F | Ht 63.0 in | Wt 160.0 lb

## 2013-08-30 DIAGNOSIS — R440 Auditory hallucinations: Secondary | ICD-10-CM | POA: Insufficient documentation

## 2013-08-30 DIAGNOSIS — R443 Hallucinations, unspecified: Secondary | ICD-10-CM

## 2013-08-30 DIAGNOSIS — M159 Polyosteoarthritis, unspecified: Secondary | ICD-10-CM

## 2013-08-30 DIAGNOSIS — M150 Primary generalized (osteo)arthritis: Secondary | ICD-10-CM

## 2013-08-30 MED ORDER — DULOXETINE HCL 30 MG PO CPEP: 30.0000 mg | ORAL_CAPSULE | Freq: Every day | ORAL | Status: DC

## 2013-08-30 NOTE — Assessment & Plan Note (Signed)
Stopping Paxil and Effexor today.  Discussed this with Attending Dr. Andria Frames and PharmD Dr. Valentina Lucks who are in agreement.

## 2013-08-30 NOTE — Progress Notes (Signed)
   Subjective:    Patient ID: Debbie Clarke, female    DOB: 1960/09/17, 54 y.o.   MRN: [id number]  HPI 53 year old female with HTN and OA presents with complaints of pain.  1) Pain  - Patient continues to have significant pain. She reports significant low back pain, bilateral wrist and hand pain, and bilateral foot pain. - She states pain is severe.  She has not had significant relief with gabapentin and tramadol. - Additionally, she has been using PRN goody powder with some improvement.  - She reports that she often uses Marijuana to help control pain. - Pain currently limits daily activities and she is currently unable to work.  Review of Systems Per HPI    Objective:   Physical Exam Filed Vitals:   08/30/13 1550  BP: 160/87  Temp: 98.3 F (36.8 C)   Exam: General: well appearing female in NAD. MSK: Hands/fingers: Inspection - normal.  No joint swelling, erythema noted. ROM limited secondary to pain. No areas (MCP, PIP, DIP) tender to palapation. Wrist: Inspection - normal. Full ROM.  Nontender to palpation. Feet: Inspection - normal other than prominent proximal 1st MTP (left). No joint swelling, erythema noted.     Assessment & Plan:  See Problem list

## 2013-08-30 NOTE — Assessment & Plan Note (Signed)
Patient with pain at multiple sites.   Appears consistent with OA.  No findings on PE suggestive of rheumatoid (distribution doesn't seem to fit either). Patient to continue tramadol. Start schedule tylenol. Discontinuing Paxil (started by [facility]) and Effexor.  Starting cymbalta in leiu to aid in pain control. No indication for narcotics.  If no improvement with above therapy, will plan to refer to pain management.

## 2013-08-30 NOTE — Patient Instructions (Addendum)
It was nice to see you today.  Stop the Paxil.  Stop the Effexor.  Start Cymbalta (if you have any difficulty affording it, then do no stop the effexor).  Continue tylenol 1000 mg TID.  Take the Tramadol every 8 hours for pain.  If you continue to have pain, we can titrate up the gabapentin.  Follow up in ~ 3 months or earlier if needed.

## 2013-09-10 ENCOUNTER — Other Ambulatory Visit: Payer: Self-pay | Admitting: Family Medicine

## 2013-09-10 ENCOUNTER — Ambulatory Visit (HOSPITAL_COMMUNITY)
Admission: RE | Admit: 2013-09-10 | Discharge: 2013-09-10 | Disposition: A | Discharge status: Home / Self Care | Payer: Medicaid Other | Source: Ambulatory Visit | Attending: Internal Medicine | Admitting: Internal Medicine

## 2013-09-10 DIAGNOSIS — Z1231 Encounter for screening mammogram for malignant neoplasm of breast: Secondary | ICD-10-CM

## 2013-09-11 ENCOUNTER — Encounter: Payer: Self-pay | Admitting: Family Medicine

## 2013-09-19 ENCOUNTER — Ambulatory Visit (INDEPENDENT_AMBULATORY_CARE_PROVIDER_SITE_OTHER): Payer: Medicaid Other | Admitting: Family Medicine

## 2013-09-19 ENCOUNTER — Encounter: Payer: Self-pay | Admitting: Family Medicine

## 2013-09-19 ENCOUNTER — Other Ambulatory Visit (HOSPITAL_COMMUNITY)
Admission: RE | Admit: 2013-09-19 | Discharge: 2013-09-19 | Disposition: A | Discharge status: Home / Self Care | Payer: Medicaid Other | Source: Ambulatory Visit | Attending: Family Medicine | Admitting: Family Medicine

## 2013-09-19 VITALS — BP 129/85 | HR 65 | Temp 97.8°F | Ht 63.0 in | Wt 164.2 lb

## 2013-09-19 DIAGNOSIS — Z1151 Encounter for screening for human papillomavirus (HPV): Secondary | ICD-10-CM | POA: Insufficient documentation

## 2013-09-19 DIAGNOSIS — Z124 Encounter for screening for malignant neoplasm of cervix: Secondary | ICD-10-CM

## 2013-09-19 DIAGNOSIS — Z0000 Encounter for general adult medical examination without abnormal findings: Secondary | ICD-10-CM

## 2013-09-19 DIAGNOSIS — Z01419 Encounter for gynecological examination (general) (routine) without abnormal findings: Secondary | ICD-10-CM | POA: Diagnosis not present

## 2013-09-19 DIAGNOSIS — Z1211 Encounter for screening for malignant neoplasm of colon: Secondary | ICD-10-CM

## 2013-09-19 DIAGNOSIS — M4802 Spinal stenosis, cervical region: Secondary | ICD-10-CM

## 2013-09-19 NOTE — Assessment & Plan Note (Signed)
Patient's numbness and tingling is likely coming from cervical radiculopathy. Recent xray revealed significant foraminal encroachment at C3-4 and C4-5. Discussed therapeutic options today: medication for neuropathic pain, referral for epidural steroid injection/facet injection, referral back to neurosurgery for consultation. Patient would like to proceed with injection therapy.  Will place referral.

## 2013-09-19 NOTE — Progress Notes (Signed)
Subjective:    Patient ID: Debbie Clarke, female    DOB: 05/11/1960, 53 y.o.   MRN: [id number]  HPI 53 year old female with past medical history of hypertension, OA, spinal stenosis status post cervical discectomy and fusion, and auditory hallucinations (followed by [facility]) who presents for her annual wellness exam.  Current issues: 1) Upper extremity numbness/tingling - Patient reports that she has chronic numbness and tingling of her first, third and fourth digits bilaterally. - She has been placed on gabapentin before with little improvement. - She states that associated discomfort is very bothersome and prevents her from doing normal activities and from working. - She would like to discuss therapeutic options today.  2) Preventative care - In need of pap smear.  Last pap smear was 06/2010 and was negative. - Mammogram done earlier this year (7/27) and was negative. - Has not had screening colonoscopy. - Also in need of Tdap.   PMH, Surgical Hx, Social History reviewed and updated as below.  Past Medical History  Diagnosis Date  . Hypertension   . GERD (gastroesophageal reflux disease)   . Blood dyscrasia     sickle cell trait    Past Surgical History  Procedure Laterality Date  . Cesarean section  1985, 1998  . Anterior cervical decomp/discectomy fusion N/A 08/07/2012    Procedure: ANTERIOR CERVICAL DECOMPRESSION/DISCECTOMY FUSION 2 LEVELS;  Surgeon: Marybelle Killings, MD;  Location: [facility];  Service: Orthopedics;  Laterality: N/A;  C5-6, C6-7 ACD&F, ALLOGRAFT AND PLATES    History   Social History  . Marital Status: Single    Spouse Name: N/A    Number of Children: N/A  . Years of Education: N/A   Social History Main Topics  . Smoking status: Former Smoker -- 0.25 packs/day for 20 years    Types: Cigarettes    Quit date: 08/02/1998  . Smokeless tobacco: Never Used  . Alcohol Use: No  . Drug Use: Yes    Special: Marijuana  . Sexual Activity: Not Currently    Other Topics Concern  . None   Social History Narrative  . None   Review of Systems  General:  Negative for nexplained weight loss, fever Skin: Negative for new or changing mole, sore that won't heal HEENT: Negative for trouble hearing, trouble seeing, ringing in ears, mouth sores, hoarseness, change in voice, dysphagia. CV:  Negative for chest pain, dyspnea, edema, palpitations Resp: Negative for cough, dyspnea, hemoptysis GI: Negative for nausea, vomiting, diarrhea, constipation, abdominal pain, melena, hematochezia. GU: Negative for dysuria, incontinence, urinary hesitance, hematuria, vaginal or penile discharge, polyuria, sexual difficulty, lumps in testicle or breasts MSK: Negative for muscle cramps or aches; Positive for joint pain or swelling Neuro: Negative for headaches, weakness, dizziness, passing out/fainting. Positive for numbness/tingling.  Psych: Positive for sadness, anxiety, stress.      Objective:   Physical Exam Filed Vitals:   09/19/13 1044  BP: 129/85  Pulse: 65  Temp: 97.8 F (36.6 C)   Exam: General: well appearing female in NAD.  Cardiovascular: RRR. No murmurs, rubs, or gallops. Respiratory: CTAB. No rales, rhonchi, or wheeze. Abdomen: soft, nontender, nondistended. Extremities: No LE edema.  Skin: Warm, dry, intact. MSK:  Wrist/Hand - Negative tinel's and phalen's.  Pelvic Exam:        External: normal female genitalia without lesions or masses        Vagina: normal without lesions or masses        Cervix: normal without lesions or masses  Pap smear: performed    Assessment & Plan:  See Problem List

## 2013-09-19 NOTE — Assessment & Plan Note (Signed)
Referral already placed for GI for colonoscopy.  Need to schedule. Mammogram done. Tdap given today. Pap smear done today.

## 2013-09-19 NOTE — Patient Instructions (Signed)
It was nice to see you today.  Below is what we've discussed today:  1) Preventative care - Tdap and Pap smear done today.  You will receive a letter in the mail with your results.  - You're up-to-date on your mammogram.  Repeat in 1 year. - You are due for screening colonoscopy. GI referral was placed today.  We will be in contact regarding appointment.  2) Hand/finger numbness/tingling - This seems to be coming from the neck. - We discussed options including medications, injection, surgical evaluation.  Follow up annually or sooner if needed.

## 2013-09-20 LAB — CYTOLOGY - PAP

## 2013-09-26 ENCOUNTER — Telehealth: Payer: Self-pay | Admitting: Family Medicine

## 2013-09-26 NOTE — Telephone Encounter (Signed)
Marcene Brawn from [facility] called and would like Dr. Lacinda Axon to fax over to them any office notes, labs and the supporting documents stating why the patient is needing a colonoscopy. The patients has an appointment on 8/14. Please fax all these documents to [phone] attention Marcene Brawn. Blima Rich

## 2013-09-27 NOTE — Telephone Encounter (Signed)
Copy of labs,office visit notes and referral Info was faxed to [place] at [facility]  office informed.Elain Wixon, Lewie Loron

## 2013-11-02 ENCOUNTER — Other Ambulatory Visit: Payer: Self-pay | Admitting: Family Medicine

## 2013-11-15 ENCOUNTER — Telehealth: Payer: Self-pay | Admitting: *Deleted

## 2013-11-15 NOTE — Telephone Encounter (Signed)
Pt asking for rx for 800 mg ibuprofen, says she discussed with her doctor about taking the otc 400 mg ibuprofen but they aren't working. [facility]

## 2013-11-16 MED ORDER — IBUPROFEN 800 MG PO TABS: 800.0000 mg | ORAL_TABLET | Freq: Three times a day (TID) | ORAL | Status: DC | PRN

## 2013-11-16 NOTE — Telephone Encounter (Signed)
Attempted to call patient back.  No VM was set up. Fleeger, Salome Spotted

## 2013-11-16 NOTE — Telephone Encounter (Signed)
Pt informed. Fleeger, Jessica Dawn  

## 2013-11-24 ENCOUNTER — Other Ambulatory Visit: Payer: Self-pay | Admitting: Family Medicine

## 2013-11-26 ENCOUNTER — Encounter (HOSPITAL_COMMUNITY): Payer: Self-pay | Admitting: Emergency Medicine

## 2013-11-26 ENCOUNTER — Emergency Department (HOSPITAL_COMMUNITY)
Admission: EM | Admit: 2013-11-26 | Discharge: 2013-11-26 | Disposition: A | Discharge status: Home / Self Care | Payer: Medicaid Other | Attending: Emergency Medicine | Admitting: Emergency Medicine

## 2013-11-26 DIAGNOSIS — Z8719 Personal history of other diseases of the digestive system: Secondary | ICD-10-CM | POA: Insufficient documentation

## 2013-11-26 DIAGNOSIS — Z79899 Other long term (current) drug therapy: Secondary | ICD-10-CM | POA: Diagnosis not present

## 2013-11-26 DIAGNOSIS — I10 Essential (primary) hypertension: Secondary | ICD-10-CM | POA: Insufficient documentation

## 2013-11-26 DIAGNOSIS — R200 Anesthesia of skin: Secondary | ICD-10-CM | POA: Diagnosis not present

## 2013-11-26 DIAGNOSIS — Z87891 Personal history of nicotine dependence: Secondary | ICD-10-CM | POA: Insufficient documentation

## 2013-11-26 DIAGNOSIS — H00013 Hordeolum externum right eye, unspecified eyelid: Secondary | ICD-10-CM | POA: Diagnosis not present

## 2013-11-26 DIAGNOSIS — M542 Cervicalgia: Secondary | ICD-10-CM | POA: Diagnosis present

## 2013-11-26 DIAGNOSIS — R202 Paresthesia of skin: Secondary | ICD-10-CM | POA: Diagnosis not present

## 2013-11-26 NOTE — ED Notes (Signed)
Pt reports she was sitting in family member's chair on Thursday. Began to notice multiple red itchy circular patches on back the next day. Swelling on R upper eyelid began Saturday.

## 2013-11-26 NOTE — Discharge Instructions (Signed)

## 2013-11-26 NOTE — ED Provider Notes (Signed)
CSN: [id number]     Arrival date & time 11/26/13  0846 History   First MD Initiated Contact with Patient 11/26/13 [phone]     Chief Complaint  Patient presents with  . Tingling  . Neck Pain     HPI Patient reports a history of cervical fusion by Dr. Inda Merlin in June of 2014.  She's had ongoing tingling and numbness in her hands and occasionally in her legs since then.  She's been seeing her primary care physician who has tried Neurontin without improvement in her symptoms.  She has not been back to her neurosurgeon yet.  She denies new weakness of her arch her legs.  She states this is been happening for months and she is tired of it.  She's been offered steroid injections which does not want these.  No fevers or chills.  No weight loss.  No other complaints.  Symptoms are mild to moderate in severity.   Past Medical History  Diagnosis Date  . Hypertension   . GERD (gastroesophageal reflux disease)   . Blood dyscrasia     sickle cell trait   Past Surgical History  Procedure Laterality Date  . Cesarean section  1985, 1998  . Anterior cervical decomp/discectomy fusion N/A 08/07/2012    Procedure: ANTERIOR CERVICAL DECOMPRESSION/DISCECTOMY FUSION 2 LEVELS;  Surgeon: Marybelle Killings, MD;  Location: [facility];  Service: Orthopedics;  Laterality: N/A;  C5-6, C6-7 ACD&F, ALLOGRAFT AND PLATES   No family history on file. History  Substance Use Topics  . Smoking status: Former Smoker -- 0.25 packs/day for 20 years    Types: Cigarettes    Quit date: 08/02/1998  . Smokeless tobacco: Never Used  . Alcohol Use: No   OB History   Grav Para Term Preterm Abortions TAB SAB Ect Mult Living                 Review of Systems  All other systems reviewed and are negative.     Allergies  Review of patient's allergies indicates no known allergies.  Home Medications   Prior to Admission medications   Medication Sig Start Date End Date Taking? Authorizing Provider  asenapine (SAPHRIS) 5 MG SUBL 24 hr  tablet Place 5 mg under the tongue 2 (two) times daily.   Yes Historical Provider, MD  ibuprofen (ADVIL,MOTRIN) 800 MG tablet Take 1 tablet (800 mg total) by mouth every 8 (eight) hours as needed. 11/16/13  Yes Coral Spikes, DO  losartan-hydrochlorothiazide (HYZAAR) 100-12.5 MG per tablet Take 1 tablet by mouth daily.   Yes Historical Provider, MD  traMADol (ULTRAM) 50 MG tablet Take 50-100 mg by mouth every 6 (six) hours as needed.   Yes Historical Provider, MD  venlafaxine XR (EFFEXOR-XR) 37.5 MG 24 hr capsule Take 37.5 mg by mouth daily with breakfast.   Yes Historical Provider, MD   BP 168/91  Pulse 63  Temp(Src) 98.4 F (36.9 C) (Oral)  Resp 20  SpO2 100% Physical Exam  Nursing note and vitals reviewed. Constitutional: She is oriented to person, place, and time. She appears well-developed and well-nourished. No distress.  HENT:  Head: Normocephalic and atraumatic.  Eyes: EOM are normal.  Neck: Normal range of motion. Neck supple.  No point cervical tenderness  Cardiovascular: Normal rate, regular rhythm and normal heart sounds.   Pulmonary/Chest: Effort normal and breath sounds normal.  Abdominal: Soft.  Musculoskeletal: Normal range of motion.  5 out of 5 strength in bilateral upper lower extremity major muscle groups  normal radial pulses bilaterally  Neurological: She is alert and oriented to person, place, and time.  Skin: Skin is warm and dry.  Psychiatric: She has a normal mood and affect. Judgment normal.    ED Course  Procedures (including critical care time) Labs Review Labs Reviewed - No data to display  Imaging Review No results found.   EKG Interpretation None      MDM   Final diagnoses:  Numbness and tingling  Stye, right    No indication for imaging or additional workup in emergency apartment.  No weakness in arms or legs.  Patient will likely need MRI performed as an outpatient but does not need to be performed today in the emergency department.   Doubt spinal epidural abscess.    Hoy Morn, MD 11/26/13 [phone]

## 2013-11-26 NOTE — ED Notes (Signed)
Pt c/o neck pain and tingling in bilateral arms and feet x a couple years, has seen neurologist but not since last October.. Pt also wants to be seen for bed bug bites and states something is wrong with my right eye. No deformity or discoloration noted to eye.

## 2013-11-26 NOTE — ED Notes (Signed)
MD at bedside. 

## 2014-04-08 ENCOUNTER — Ambulatory Visit: Payer: Medicaid Other

## 2014-04-15 ENCOUNTER — Telehealth: Payer: Self-pay | Admitting: Family Medicine

## 2014-04-15 MED ORDER — IBUPROFEN 800 MG PO TABS: 800.0000 mg | ORAL_TABLET | Freq: Three times a day (TID) | ORAL | Status: DC | PRN

## 2014-04-15 NOTE — Telephone Encounter (Signed)
Rx sent 

## 2014-04-15 NOTE — Telephone Encounter (Signed)
Needs refill on ibeuphron

## 2014-06-08 ENCOUNTER — Other Ambulatory Visit: Payer: Self-pay | Admitting: Family Medicine

## 2014-07-03 ENCOUNTER — Ambulatory Visit (INDEPENDENT_AMBULATORY_CARE_PROVIDER_SITE_OTHER): Payer: Medicaid Other | Admitting: Family Medicine

## 2014-07-03 ENCOUNTER — Encounter: Payer: Self-pay | Admitting: Family Medicine

## 2014-07-03 VITALS — BP 125/87 | HR 85 | Temp 97.9°F | Wt 163.1 lb

## 2014-07-03 DIAGNOSIS — R52 Pain, unspecified: Secondary | ICD-10-CM

## 2014-07-03 DIAGNOSIS — R440 Auditory hallucinations: Secondary | ICD-10-CM | POA: Diagnosis not present

## 2014-07-03 DIAGNOSIS — R5383 Other fatigue: Secondary | ICD-10-CM

## 2014-07-03 DIAGNOSIS — Z13220 Encounter for screening for lipoid disorders: Secondary | ICD-10-CM | POA: Diagnosis not present

## 2014-07-03 DIAGNOSIS — I10 Essential (primary) hypertension: Secondary | ICD-10-CM

## 2014-07-03 LAB — CBC
HCT: 39.5 % (ref 36.0–46.0)
Hemoglobin: 13.2 g/dL (ref 12.0–15.0)
MCH: 26.9 pg (ref 26.0–34.0)
MCHC: 33.4 g/dL (ref 30.0–36.0)
MCV: 80.6 fL (ref 78.0–100.0)
MPV: 10 fL (ref 8.6–12.4)
Platelets: 255 10*3/uL (ref 150–400)
RBC: 4.9 MIL/uL (ref 3.87–5.11)
RDW: 16.3 % — AB (ref 11.5–15.5)
WBC: 4.4 10*3/uL (ref 4.0–10.5)

## 2014-07-03 LAB — LIPID PANEL
Cholesterol: 169 mg/dL (ref 0–200)
HDL: 41 mg/dL — ABNORMAL LOW (ref >=46)
LDL Cholesterol: 102 mg/dL — ABNORMAL HIGH (ref 0–99)
Total CHOL/HDL Ratio: 4.1 Ratio
Triglycerides: 132 mg/dL (ref <150)
VLDL: 26 mg/dL (ref 0–40)

## 2014-07-03 LAB — COMPLETE METABOLIC PANEL WITH GFR
ALT: 12 U/L (ref 0–35)
AST: 14 U/L (ref 0–37)
Albumin: 4.5 g/dL (ref 3.5–5.2)
Alkaline Phosphatase: 72 U/L (ref 39–117)
BILIRUBIN TOTAL: 0.5 mg/dL (ref 0.2–1.2)
BUN: 12 mg/dL (ref 6–23)
CO2: 34 meq/L — AB (ref 19–32)
CREATININE: 0.67 mg/dL (ref 0.50–1.10)
Calcium: 9.7 mg/dL (ref 8.4–10.5)
Chloride: 98 mEq/L (ref 96–112)
GLUCOSE: 106 mg/dL — AB (ref 70–99)
Potassium: 3.3 mEq/L — ABNORMAL LOW (ref 3.5–5.3)
Sodium: 138 mEq/L (ref 135–145)
Total Protein: 7.5 g/dL (ref 6.0–8.3)

## 2014-07-03 LAB — TSH: TSH: 0.477 u[IU]/mL (ref 0.350–4.500)

## 2014-07-03 MED ORDER — MELOXICAM 15 MG PO TABS: 15.0000 mg | ORAL_TABLET | Freq: Every day | ORAL | Status: DC

## 2014-07-03 NOTE — Patient Instructions (Signed)
It was nice to see you today. Use the medication as needed. Our office or theirs will be in touch regarding her pain clinic referral. Follow up in 1-3 months.

## 2014-07-03 NOTE — Assessment & Plan Note (Signed)
Patient with continued pain with unremarkable physical exam. She does have significant OA of the cervical spine but the remainder of her physical exam does not reveal an underlying cause for her diffuse pain. She has been diagnosed with fibromyalgia in the past. She has been treated with numerous agents with little improvement. The only intervention that she has had some improvement with is NSAIDs. I do not feel that chronic opioid therapy is a good option for her given her psychiatric illness and prior marijuana use. As a result, I started the patient on Mobic for a short-term course and cautioned her about the side effects and problems occur with NSAID use. She understands the risk and would like to proceed with this.   She will continue to use Mobic as needed while awaiting pain management referral. Additionally, I feel like the patient may benefit from physical therapy. Referral placed today.

## 2014-07-03 NOTE — Progress Notes (Signed)
   Subjective:    Patient ID: Debbie Clarke, female    DOB: 1960-10-01, 54 y.o.   MRN: [id number]  HPI 54 year old female presents presents to clinic today for evaluation of "pain all over".  1) "Pain all over"  Patient has long-standing history of chronic pain. She states that she was diagnosed with fibromyalgia at [facility] nearly 10 years ago.  She's been treated with several medications previously. She has tried the following with little improvement: Cymbalta, Effexor, gabapentin, tramadol.  She has had some mild improvement previously with NSAIDs and marijuana use.  She presents today with complaints of pain all over - upper back, neck, feet, elbows.  She has recently stopped using marijuana. She has not been taking anything for her pain as of recent as I have cautioned her about the use of NSAIDs previously.   She states that her pain is worse in the morning. No known exacerbating factors or relieving factors.  She reports an association sensation of tingling which she feels is consistent with fibromyalgia.  She states her pain has been slowly worsening over the past 8 months.  She states that he continues to interfere with her ability to work. She currently does not have a job.  Additionally, she feels like it impacts her social relationships as she does not go out much and spends most of her time to herself.  2) Auditory hallucinations  Patient has a long-standing history of auditory hallucinations. I'm unclear what the diagnosis is as she is followed by [facility] and I do not have any records.  I do suspect however that the diagnosis is schizophrenia given history.  She states that the hallucinations have been worsening recently. She is fully aware that these are not real. She states that most of her auditory hallucinations are bad (i.e. the voices tell her to harm herself). However, she has other times where the voices are okay and are simply like having normal  conversations with her friend.   I asked her about suicidal ideation and she reports that this has crossed her mind previously. She has no current plan to do so.  She states that she is compliant with her medications and has been following up with [facility].   Social Hx - Nonsmoker.   Review of Systems  Constitutional: Negative.   Musculoskeletal: Positive for myalgias, back pain and arthralgias.  Psychiatric/Behavioral: Positive for hallucinations.      Objective:   Physical Exam Filed Vitals:   07/03/14 1031  BP: 125/87  Pulse: 85  Temp: 97.9 F (36.6 C)   Vital signs reviewed.  Exam: General: well appearing, NAD.  Cardiovascular: RRR. No murmurs, rubs, or gallops. Respiratory: CTAB. No rales, rhonchi, or wheeze. MSK:  Elbow - Bilateral Unremarkable to inspection. Range of motion full pronation, supination, flexion, extension. Strength is full.  Stable to varus, valgus stress.  Patient with tenderness diffusely over the elbow.  Back - unremarkable to inspection. Thoracic spine tender to palpation. Decreased range of motion secondary to pain.     Assessment & Plan:  See Problem List  30 minutes were spent face-to-face with the patient during this encounter and over half of that time was spent on counseling and coordination of care.

## 2014-07-03 NOTE — Assessment & Plan Note (Signed)
Given recent increase in auditory hallucinations I encouraged her to follow-up soon with [facility]. She has previously had suicidal ideation but is not currently and does not have a plan to do so. I feel like the patient is stable at this time and can continued to be followed on an outpatient basis.

## 2014-07-05 NOTE — Progress Notes (Signed)
I was the preceptor on the day of this visit.   Tigerlily Christine MD  

## 2014-07-06 ENCOUNTER — Encounter: Payer: Self-pay | Admitting: Family Medicine

## 2014-07-29 ENCOUNTER — Ambulatory Visit: Payer: Medicaid Other

## 2014-08-05 ENCOUNTER — Ambulatory Visit: Payer: Medicaid Other | Admitting: Physical Therapy

## 2014-08-26 ENCOUNTER — Ambulatory Visit: Payer: Medicaid Other | Admitting: Physical Therapy

## 2014-08-27 ENCOUNTER — Other Ambulatory Visit: Payer: Self-pay | Admitting: *Deleted

## 2014-08-28 MED ORDER — VENLAFAXINE HCL ER 37.5 MG PO CP24: 37.5000 mg | ORAL_CAPSULE | Freq: Every day | ORAL | Status: DC

## 2014-09-11 ENCOUNTER — Ambulatory Visit: Payer: Medicaid Other | Attending: Family Medicine | Admitting: Physical Therapy

## 2014-09-11 DIAGNOSIS — R293 Abnormal posture: Secondary | ICD-10-CM

## 2014-09-11 DIAGNOSIS — M25532 Pain in left wrist: Secondary | ICD-10-CM | POA: Insufficient documentation

## 2014-09-11 DIAGNOSIS — M25529 Pain in unspecified elbow: Secondary | ICD-10-CM

## 2014-09-11 DIAGNOSIS — R29898 Other symptoms and signs involving the musculoskeletal system: Secondary | ICD-10-CM | POA: Diagnosis present

## 2014-09-11 DIAGNOSIS — M25512 Pain in left shoulder: Secondary | ICD-10-CM | POA: Diagnosis present

## 2014-09-11 DIAGNOSIS — M25531 Pain in right wrist: Secondary | ICD-10-CM

## 2014-09-11 DIAGNOSIS — M25511 Pain in right shoulder: Secondary | ICD-10-CM

## 2014-09-11 NOTE — Patient Instructions (Signed)
   Keighley Deckman PT, DPT, LAT, ATC  [facility] Phone: [phone]     

## 2014-09-11 NOTE — Therapy (Signed)
[place], [place], [address] Phone: [phone]   Fax:  [phone]  Physical Therapy Evaluation  Patient Details  Name: Debbie Clarke MRN: [id number] Date of Birth: 1960-07-02 Referring Provider:  Coral Spikes, DO  Encounter Date: 09/11/2014      PT End of Session - 09/11/14 1057    Visit Number 1   Number of Visits 1   Date for PT Re-Evaluation 09/12/14   Authorization Type Medicaid   PT Start Time 0845   PT Stop Time 0930   PT Time Calculation (min) 45 min   Activity Tolerance Patient tolerated treatment well;Patient limited by pain   Behavior During Therapy [facility] for tasks assessed/performed      Past Medical History  Diagnosis Date  . Hypertension   . GERD (gastroesophageal reflux disease)   . Blood dyscrasia     sickle cell trait    Past Surgical History  Procedure Laterality Date  . Cesarean section  1985, 1998  . Anterior cervical decomp/discectomy fusion N/A 08/07/2012    Procedure: ANTERIOR CERVICAL DECOMPRESSION/DISCECTOMY FUSION 2 LEVELS;  Surgeon: Marybelle Killings, MD;  Location: [facility];  Service: Orthopedics;  Laterality: N/A;  C5-6, C6-7 ACD&F, ALLOGRAFT AND PLATES    There were no vitals filed for this visit.  Visit Diagnosis:  Bilateral shoulder pain - Plan: PT plan of care cert/re-cert  Bilateral elbow joint pain, unspecified laterality - Plan: PT plan of care cert/re-cert  Bilateral wrist pain - Plan: PT plan of care cert/re-cert  Weakness of both arms - Plan: PT plan of care cert/re-cert  Abnormal posture - Plan: PT plan of care cert/re-cert      Subjective Assessment - 09/11/14 0855    Subjective pt is a 54 y.o F with CC of hx of diffuse pain all over her body with report that her shoulders, arms and hands are worse that started since her neck and lumbar spine surgery June23/2013. she states she has trouble just letting her arms hang and exteme difficulty with twist off  caps.    Limitations Lifting;House hold activities;Standing;Walking;Sitting   How long can you sit comfortably? 20-30 min   How long can you stand comfortably? 20-30 min   How long can you walk comfortably? 20 min (but trying to push it more)   Diagnostic tests x-ray 08/07/2012 ACDF C5-6 and C6-7 with suboptimal visualization of the lower   Patient Stated Goals to be pain free and to be able    Currently in Pain? Yes   Pain Score 10-Worst pain ever   Pain Location Arm   Pain Orientation Right;Left   Pain Descriptors / Indicators Tingling;Shooting;Sharp   Pain Type Chronic pain   Pain Radiating Towards down both arms into finger tips   Pain Onset More than a month ago   Pain Frequency Constant   Aggravating Factors  letting the arms hang, lifting, carrying items, sleeping/laying down   Pain Relieving Factors holding arms across chest,    Multiple Pain Sites Yes   Pain Score 10   Pain Location Back   Pain Orientation Right;Left   Pain Descriptors / Indicators Aching;Tingling;Sharp;Shooting;Numbness   Pain Type Chronic pain   Pain Radiating Towards down bil legs into the toes   Pain Onset More than a month ago   Pain Frequency Constant   Aggravating Factors  walking, standing, stairs   Pain Relieving Factors sitting and resting  [facility] PT Assessment - 09/11/14 0903    Assessment   Medical Diagnosis diffuse pain   pt reports upper extremities are worse   Onset Date/Surgical Date 08/07/12   Hand Dominance Right   Next MD Visit 09/18/2014   Prior Therapy yes   for back and neck   Precautions   Precautions Back   Precaution Comments no lifting over 10 pounds   Restrictions   Weight Bearing Restrictions No   Balance Screen   Has the patient fallen in the past 6 months Yes   How many times? 3   Has the patient had a decrease in activity level because of a fear of falling?  Yes   Is the patient reluctant to leave their home because of a fear of falling?  Yes   [place] Private residence   Living Arrangements Children   Available Help at Discharge Available 24 hours/day;Available PRN/intermittently   Type of Home House   Home Access Level entry   Home Layout Two level   Alternate Level Stairs-Number of Steps 14   Alternate Level Stairs-Rails Right   Home Equipment Crutches  TENs unit   Prior Function   Level of Independence Independent;Independent with basic ADLs   Vocation Other (comment)  working on getting disability   Leisure reading bible, doing crosswordds   Cognition   Overall Cognitive Status Within Functional Limits for tasks assessed   Sensation   Light Touch Impaired Detail   Light Touch Impaired Details Impaired RUE;Impaired LUE  limited sensation at C4 dermatomes bil   Posture/Postural Control   Posture/Postural Control Postural limitations   Postural Limitations Rounded Shoulders;Forward head;Increased thoracic kyphosis   ROM / Strength   AROM / PROM / Strength AROM;PROM;Strength   AROM   AROM Assessment Site Shoulder;Elbow;Wrist   Right/Left Shoulder Right;Left   Right Shoulder Extension 35 Degrees   Right Shoulder Flexion 60 Degrees  pain at end range   Right Shoulder ABduction 90 Degrees  pain at end range   Right Shoulder Internal Rotation 60 Degrees  pain at end range   Right Shoulder External Rotation 60 Degrees  pain at end range   Left Shoulder Extension 45 Degrees  pain at end range   Left Shoulder Flexion 60 Degrees  pain at end range   Left Shoulder ABduction 90 Degrees  pain at end range   Left Shoulder Internal Rotation 60 Degrees  pain at end range   Left Shoulder External Rotation 60 Degrees  pain at end range   Right/Left Elbow Right;Left   Right Elbow Flexion 148  pain at end range   Right Elbow Extension 0  pain at end range   Left Elbow Flexion 146  pain at end range   Left Elbow Extension 0   Right/Left Wrist Left   Right Wrist Extension 40 Degrees   Right  Wrist Flexion 72 Degrees   Left Wrist Extension 50 Degrees   Left Wrist Flexion 72 Degrees   PROM   Overall PROM  Within functional limits for tasks performed   Strength   Strength Assessment Site Shoulder;Elbow;Wrist;Hand   Right/Left Shoulder Right;Left   Right Shoulder Flexion 3/5   Right Shoulder Extension 3+/5   Right Shoulder ABduction 3/5   Right Shoulder Internal Rotation 3+/5   Right Shoulder External Rotation 3+/5   Left Shoulder Flexion 3/5   Left Shoulder Extension 3+/5   Left Shoulder ABduction 3/5   Left Shoulder Internal Rotation 3/5  Left Shoulder External Rotation 3/5   Right/Left Elbow Right;Left   Right Elbow Flexion 3+/5   Right Elbow Extension 3+/5   Left Elbow Flexion 3+/5   Left Elbow Extension 3+/5   Right/Left Wrist Right;Left   Right Wrist Flexion 3+/5   Right Wrist Extension 3+/5   Right Wrist Radial Deviation 3+/5   Right Wrist Ulnar Deviation 3+/5   Left Wrist Flexion 3+/5   Left Wrist Extension 3+/5   Left Wrist Radial Deviation 3+/5   Right/Left hand Right;Left   Right Hand Grip (lbs) 15#   Left Hand Grip (lbs) 10#   Palpation   Palpation comment tenderness throughout bil UE musculatre   Special Tests    Special Tests Cervical   Cervical Tests other   other    Findings Positive  ULTT Median, ulnar, radian   Ambulation/Gait   Gait Pattern Decreased arm swing - right;Decreased arm swing - left;Antalgic  keeps arms folded acrossed her chest for comfort                           PT Education - 09/11/14 1057    Education provided Yes   Education Details Evaluation findings, HEP, and anatomical education   Person(s) Educated Patient   Methods Explanation   Comprehension Verbalized understanding                    Plan - 09/11/14 1058    Clinical Impression Statement Gladys Damme presents to OPPT with CC of total body diffuse pain with report of increased pain in bil UE with increased difficutly lifting,  carrying and manipulating objects with her hands/fingers with report of numbness/ tingling since her neck ACDF surgery in June of 2014. She demonstrates limited shoulder mobility and weakness secondary to pain. Elbow and wrist AROM are WNL, with weakness of 3+/5 due to pain. She exhibits postivie ULTT of the median, ulnar and radial nerves with referral into the hands/fingers..Dermatome assessment revealed  absent sensation in the C4 dermatome bil. she exhibits forward head posture, anteriorly rolled shoulders, and  increased thoracic kyphosis. she keeps her arms folded across her chest for comfort due to pain with letting her arms hang. Edcuated on posture and anatomy and provided HEP.    PT Frequency --  1 visit   PT Next Visit Plan Medicaid only 1 authorized visit   PT Home Exercise Plan see HEP handout   Consulted and Agree with Plan of Care Patient         Problem List Patient Active Problem List   Diagnosis Date Noted  . Auditory hallucinations 08/30/2013  . Diffuse pain 07/27/2013  . Low back pain 05/26/2013  . Hot flashes 05/26/2013  . Preventative health care 05/26/2013  . Spinal stenosis in cervical region 08/08/2012    Class: Diagnosis of  . HYPERTENSION, BENIGN ESSENTIAL 04/27/2010   Starr Lake PT, DPT, LAT, ATC  09/11/2014  12:00 PM    [facility] [address] [place], [place], [address] Phone: [phone]   Fax:  [phone]

## 2014-09-20 ENCOUNTER — Other Ambulatory Visit: Payer: Self-pay | Admitting: Family Medicine

## 2014-09-20 DIAGNOSIS — Z1231 Encounter for screening mammogram for malignant neoplasm of breast: Secondary | ICD-10-CM

## 2014-09-25 ENCOUNTER — Telehealth: Payer: Self-pay | Admitting: *Deleted

## 2014-09-25 NOTE — Telephone Encounter (Signed)
Prior Authorization received from [facility] for Losartan-HCTZ. Formulary and PA form placed in provider box for completion. Derl Barrow, RN

## 2014-09-27 NOTE — Telephone Encounter (Signed)
Completed PA info in [facility] for Losartan-HCTZ.  Per Dr. Kristine Royal unable to tolerate Lisinopril and med was discontinued in 2014.  Status pending.  Will recheck status in 24 hours. Burna Forts, BSN, RN-BC

## 2014-09-30 NOTE — Telephone Encounter (Signed)
Received PA approval for Losartan-HCTZ via [employer].  Med approved for 09/27/14 - 09/22/15.  [facility] informed.  PA approval number [id number]. Derl Barrow, RN

## 2014-10-02 ENCOUNTER — Other Ambulatory Visit: Payer: Self-pay | Admitting: *Deleted

## 2014-10-02 ENCOUNTER — Ambulatory Visit (HOSPITAL_COMMUNITY)
Admission: RE | Admit: 2014-10-02 | Discharge: 2014-10-02 | Disposition: A | Discharge status: Home / Self Care | Payer: Medicaid Other | Source: Ambulatory Visit | Attending: Family Medicine | Admitting: Family Medicine

## 2014-10-02 DIAGNOSIS — Z1231 Encounter for screening mammogram for malignant neoplasm of breast: Secondary | ICD-10-CM | POA: Diagnosis present

## 2014-10-02 MED ORDER — MELOXICAM 15 MG PO TABS: 15.0000 mg | ORAL_TABLET | Freq: Every day | ORAL | Status: DC

## 2014-10-07 ENCOUNTER — Encounter: Payer: Self-pay | Admitting: Internal Medicine

## 2014-10-07 ENCOUNTER — Ambulatory Visit (INDEPENDENT_AMBULATORY_CARE_PROVIDER_SITE_OTHER): Payer: Medicaid Other | Admitting: Internal Medicine

## 2014-10-07 DIAGNOSIS — I10 Essential (primary) hypertension: Secondary | ICD-10-CM | POA: Diagnosis not present

## 2014-10-07 DIAGNOSIS — Z0000 Encounter for general adult medical examination without abnormal findings: Secondary | ICD-10-CM | POA: Diagnosis present

## 2014-10-07 MED ORDER — LOSARTAN POTASSIUM-HCTZ 100-12.5 MG PO TABS: 1.0000 | ORAL_TABLET | Freq: Every day | ORAL | Status: DC

## 2014-10-07 MED ORDER — MELOXICAM 15 MG PO TABS: 15.0000 mg | ORAL_TABLET | Freq: Every day | ORAL | Status: DC

## 2014-10-07 MED ORDER — ASPIRIN EC 81 MG PO TBEC: 81.0000 mg | DELAYED_RELEASE_TABLET | Freq: Every day | ORAL | Status: DC

## 2014-10-07 NOTE — Patient Instructions (Addendum)
It was great seeing you today!   1. I hope your visit with pain management goes well.  2. I will let you know if there are any concerning lab values from your blood work today.  3. Please start taking your Aspirin daily.  4. Let me know if you have any problems with refilling your medications    Please bring all your medications to every doctors visit  Sign up for My Chart to have easy access to your labs results, and communication with your Primary care physician.  Next Appointment  Please call to make an appointment with Dr. Juleen China in 6 months   I look forward to talking with you again at our next visit. If you have any questions or concerns before then, please call the clinic at [phone].  Take Care,   Dr. Phill Myron

## 2014-10-07 NOTE — Progress Notes (Signed)
Subjective:    Patient ID: Debbie Clarke, female    DOB: 11-04-1960, 54 y.o.   MRN: [id number]  HPI 54 year old female with past medical history of hypertension, OA, spinal stenosis status post cervical discectomy and fusion, and auditory hallucinations (followed by [place]) who presents for her annual wellness exam.  Current issues: 1) Upper extremity pain & numbness/tingling  - Complains of chronic pain in her UE, mostly located bilaterally in elbows and hands  - She has been placed on gabapentin before with little improvement. -Mobic helps control some of the pain -Is being followed by Pain Management; states that they have not been able to start tx 2/2 to her chronic marijuana abuse but she is also not interested in opiods  - She states that associated discomfort is very bothersome and prevents her from doing normal activities and from working - was seen for one PT session w/o improvement; medicaid would only cover the one session   2) Preventative care - Last Pap smear was performed Aug 2015 and was negative for intraepithelial lesions and malignancy. No high risk HPV detected.  - Mammogram done earlier this year (8/17) and was negative. - Had screening colonoscopy Sept 2015 that was negative. Recommended repeat colonoscopy in 10 years -received Tdap vaccine last year -denies Flu vaccine for this year -has not had HIV screening or Hepatitis C screening in the past   Diet: Eats many veggies/fruits. Avoids fried foods.  Exercise: Walks daily, trying to increase her distance each day  LMP Sept 2015. Endorses hot flashes.  POA: Daughter.    Past Medical History  Diagnosis Date  . Hypertension   . GERD (gastroesophageal reflux disease)   . Blood dyscrasia     sickle cell trait    Past Surgical History  Procedure Laterality Date  . Cesarean section  1985, 1998  . Anterior cervical decomp/discectomy fusion N/A 08/07/2012    Procedure: ANTERIOR CERVICAL  DECOMPRESSION/DISCECTOMY FUSION 2 LEVELS;  Surgeon: Marybelle Killings, MD;  Location: [facility];  Service: Orthopedics;  Laterality: N/A;  C5-6, C6-7 ACD&F, ALLOGRAFT AND PLATES    Social History   Social History  . Marital Status: Single    Spouse Name: N/A  . Number of Children: N/A  . Years of Education: N/A   Social History Main Topics  . Smoking status: Former Smoker -- 0.25 packs/day for 20 years    Types: Cigarettes    Quit date: 08/02/1998  . Smokeless tobacco: Never Used  . Alcohol Use: No  . Drug Use: Yes    Special: Marijuana  . Sexual Activity: Not Currently   Other Topics Concern  . None   Social History Narrative   Review of Systems  General:  Negative for nexplained weight loss, fever Skin: Negative for new or changing mole, sore that won't heal HEENT: Negative for trouble hearing, trouble seeing, ringing in ears, mouth sores, hoarseness, change in voice, dysphagia. CV:  Negative for chest pain, dyspnea, edema, palpitations Resp: Negative for cough, dyspnea, hemoptysis GI: Negative for nausea, vomiting, diarrhea, constipation, abdominal pain, melena, hematochezia. GU: Negative for dysuria, vaginal discharge, and lumps in breasts.  MSK: Negative for muscle cramps or aches; Positive for joint pain or swelling Neuro: Negative for headaches, weakness, dizziness, passing out/fainting. Positive for numbness/tingling.  Psych: Positive for stress and anxiety.      Objective:   Physical Exam Filed Vitals:   10/07/14 1440  BP: 148/94  Pulse: 71  Temp: 98.5 F (36.9 C)  Exam: General: well appearing female in NAD.  HEENT: Head normocephalic atraumatic. No LAD. Ears normal bilat. Throat without erythema or exudates.  Cardiovascular: RRR. No murmurs, rubs, or gallops. Respiratory: CTAB. No rales, rhonchi, or wheeze. Abdomen: soft, nontender, nondistended, +bs  Extremities: No LE edema Skin: Warm, dry, intact. MSK: Decreased active ROM in neck and elbows. Passive ROM  intact. Strength 5/5 bilaterally in upper and lower extremities.  Neuro: A&Ox3. CN II-XII grossly intact.     Assessment & Plan:  See Problem List

## 2014-10-07 NOTE — Assessment & Plan Note (Signed)
HIV and Hep C screening performed today. Patient declined Flu vaccine. Up to date on mammogram, pap smear, and colonoscopy.

## 2014-10-07 NOTE — Assessment & Plan Note (Signed)
Well controlled. Monitors BP at home and has not had any severe range pressures. Calculated 8.6% ASCVD risk and Framingham risk of 2%. Discussed option of starting Statin and Aspirin. Patient elected to start 81 mg of ASA daily, but declined statin at present. Has had slightly elevated glucose (107) on BMET in the past. Will recheck glucose fasting at a future visit.

## 2014-10-08 LAB — HEPATITIS C ANTIBODY: HCV Ab: NEGATIVE

## 2014-10-08 LAB — HIV ANTIBODY (ROUTINE TESTING W REFLEX): HIV 1&2 Ab, 4th Generation: NONREACTIVE

## 2014-10-09 ENCOUNTER — Encounter: Payer: Self-pay | Admitting: Internal Medicine

## 2014-12-06 ENCOUNTER — Encounter (HOSPITAL_COMMUNITY): Payer: Self-pay | Admitting: Emergency Medicine

## 2014-12-06 ENCOUNTER — Emergency Department (HOSPITAL_COMMUNITY)
Admission: EM | Admit: 2014-12-06 | Discharge: 2014-12-06 | Disposition: A | Discharge status: Home / Self Care | Payer: Medicaid Other | Attending: Emergency Medicine | Admitting: Emergency Medicine

## 2014-12-06 ENCOUNTER — Emergency Department (HOSPITAL_COMMUNITY): Payer: Medicaid Other

## 2014-12-06 DIAGNOSIS — Z862 Personal history of diseases of the blood and blood-forming organs and certain disorders involving the immune mechanism: Secondary | ICD-10-CM | POA: Insufficient documentation

## 2014-12-06 DIAGNOSIS — S8991XA Unspecified injury of right lower leg, initial encounter: Secondary | ICD-10-CM | POA: Insufficient documentation

## 2014-12-06 DIAGNOSIS — Z7982 Long term (current) use of aspirin: Secondary | ICD-10-CM | POA: Insufficient documentation

## 2014-12-06 DIAGNOSIS — M797 Fibromyalgia: Secondary | ICD-10-CM | POA: Insufficient documentation

## 2014-12-06 DIAGNOSIS — Y9289 Other specified places as the place of occurrence of the external cause: Secondary | ICD-10-CM | POA: Diagnosis not present

## 2014-12-06 DIAGNOSIS — S80212A Abrasion, left knee, initial encounter: Secondary | ICD-10-CM | POA: Diagnosis not present

## 2014-12-06 DIAGNOSIS — W1839XA Other fall on same level, initial encounter: Secondary | ICD-10-CM | POA: Insufficient documentation

## 2014-12-06 DIAGNOSIS — R202 Paresthesia of skin: Secondary | ICD-10-CM | POA: Insufficient documentation

## 2014-12-06 DIAGNOSIS — I10 Essential (primary) hypertension: Secondary | ICD-10-CM | POA: Insufficient documentation

## 2014-12-06 DIAGNOSIS — M1990 Unspecified osteoarthritis, unspecified site: Secondary | ICD-10-CM | POA: Insufficient documentation

## 2014-12-06 DIAGNOSIS — Y998 Other external cause status: Secondary | ICD-10-CM | POA: Diagnosis not present

## 2014-12-06 DIAGNOSIS — Z87891 Personal history of nicotine dependence: Secondary | ICD-10-CM | POA: Insufficient documentation

## 2014-12-06 DIAGNOSIS — Z79899 Other long term (current) drug therapy: Secondary | ICD-10-CM | POA: Insufficient documentation

## 2014-12-06 DIAGNOSIS — Y9301 Activity, walking, marching and hiking: Secondary | ICD-10-CM | POA: Insufficient documentation

## 2014-12-06 DIAGNOSIS — S8992XA Unspecified injury of left lower leg, initial encounter: Secondary | ICD-10-CM

## 2014-12-06 HISTORY — DX: Personal history of diseases of the blood and blood-forming organs and certain disorders involving the immune mechanism: Z86.2

## 2014-12-06 HISTORY — DX: Unspecified osteoarthritis, unspecified site: M19.90

## 2014-12-06 HISTORY — DX: Fibromyalgia: M79.7

## 2014-12-06 MED ORDER — OXYCODONE-ACETAMINOPHEN 5-325 MG PO TABS
1.0000 | ORAL_TABLET | Freq: Once | ORAL | Status: AC
  Administered 2014-12-06: 1 via ORAL
  Filled 2014-12-06: qty 1

## 2014-12-06 MED ORDER — BACITRACIN ZINC 500 UNIT/GM EX OINT
TOPICAL_OINTMENT | CUTANEOUS | Status: AC
  Administered 2014-12-06: 1
  Filled 2014-12-06: qty 1.8

## 2014-12-06 MED ORDER — IBUPROFEN 800 MG PO TABS: 800.0000 mg | ORAL_TABLET | Freq: Three times a day (TID) | ORAL | Status: DC | PRN

## 2014-12-06 MED ORDER — HYDROCODONE-ACETAMINOPHEN 5-325 MG PO TABS: 1.0000 | ORAL_TABLET | Freq: Four times a day (QID) | ORAL | Status: DC | PRN

## 2014-12-06 NOTE — ED Notes (Signed)
Patient states she fell on the sidewalk 2 days ago. C/O pain to bilateral knee and elbow pain. Patient with scrapes to both knees. Patient states there is tingling behind both knees going down to her feet that is an ongoing problem, states there is new numbness as well with an occasional "shock of pain" behind her knee cap. Patient also c/o bilateral elbow pain that is chronic. Patient reports hx of fibromyalgia. Patient states it hurts to walk, patient was ambulatory to triage at a slow pace. Patient states she took a goody's powder the day she fell, and that she has been cleaning her left knee abrasion with peroxide. Yellow drainage noted to bandage patient was wearing.

## 2014-12-06 NOTE — Discharge Instructions (Signed)
Return here as needed.  Follow-up with the orthopedist provided.  The x-rays did not show any significant abnormality to your knee, ice and elevate your knee

## 2014-12-06 NOTE — ED Provider Notes (Signed)
CSN: [id number]     Arrival date & time 12/06/14  0945 History   First MD Initiated Contact with Patient 12/06/14 1002     Chief Complaint  Patient presents with  . Fall    knee and elbow pain     (Consider location/radiation/quality/duration/timing/severity/associated sxs/prior Treatment) HPI Patient presents to the emergency department with left knee pain following a fall that occurred 2 days ago.  The patient that she was walking, felt her legs give out and she fell directly on both knees but has worse pain on the left.  Patient has an abrasion to the left knee.  She states that her right knee is a little bit sore, but nowhere near as sore as the left knee.  Patient states she has been cleaning the abrasion to the left knee and using Neosporin.  Patient denies numbness other than tingling from the left knee down to the foot.  The patient states that she has not have any back pain, neck pain, head injury, or syncope.  The patient states she has taken over-the-counter medications for her pain with some relief Past Medical History  Diagnosis Date  . Hypertension   . GERD (gastroesophageal reflux disease)   . Blood dyscrasia     sickle cell trait  . Fibromyalgia   . Arthritis   . Hx of sickle cell trait    Past Surgical History  Procedure Laterality Date  . Cesarean section  1985, 1998  . Anterior cervical decomp/discectomy fusion N/A 08/07/2012    Procedure: ANTERIOR CERVICAL DECOMPRESSION/DISCECTOMY FUSION 2 LEVELS;  Surgeon: Marybelle Killings, MD;  Location: [facility];  Service: Orthopedics;  Laterality: N/A;  C5-6, C6-7 ACD&F, ALLOGRAFT AND PLATES   No family history on file. Social History  Substance Use Topics  . Smoking status: Former Smoker -- 0.25 packs/day for 20 years    Types: Cigarettes    Quit date: 08/02/1998  . Smokeless tobacco: Never Used  . Alcohol Use: No   OB History    No data available     Review of Systems All other systems negative except as documented in the  HPI. All pertinent positives and negatives as reviewed in the HPI.=   Allergies  Review of patient's allergies indicates no known allergies.  Home Medications   Prior to Admission medications   Medication Sig Start Date End Date Taking? Authorizing Provider  asenapine (SAPHRIS) 5 MG SUBL 24 hr tablet Place 5 mg under the tongue 2 (two) times daily.   Yes Historical Provider, MD  aspirin EC 81 MG tablet Take 1 tablet (81 mg total) by mouth daily. 10/07/14  Yes Nicolette Bang, DO  losartan-hydrochlorothiazide (HYZAAR) 100-12.5 MG per tablet TAKE 1 TABLET BY MOUTH DAILY. 06/10/14  Yes Coral Spikes, DO  meloxicam (MOBIC) 15 MG tablet Take 1 tablet (15 mg total) by mouth daily. 10/07/14  Yes Nicolette Bang, DO  PARoxetine (PAXIL) 20 MG tablet Take 1 tablet by mouth daily. 09/24/14  Yes Historical Provider, MD  venlafaxine XR (EFFEXOR-XR) 37.5 MG 24 hr capsule Take 1 capsule (37.5 mg total) by mouth daily with breakfast. 08/28/14  Yes Nicolette Bang, DO  ibuprofen (ADVIL,MOTRIN) 800 MG tablet Take 1 tablet (800 mg total) by mouth every 8 (eight) hours as needed. Patient not taking: Reported on 09/11/2014 04/15/14   Coral Spikes, DO   BP 131/83 mmHg  Pulse 82  Temp(Src) 98.1 F (36.7 C) (Oral)  Resp 14  SpO2 100%  LMP 07/06/2012  Physical Exam  Constitutional: She is oriented to person, place, and time.  HENT:  Head: Normocephalic and atraumatic.  Eyes: Pupils are equal, round, and reactive to light.  Pulmonary/Chest: Effort normal.  Musculoskeletal:       Legs: Neurological: She is alert and oriented to person, place, and time. She exhibits normal muscle tone. Coordination normal.  Skin: Skin is warm and dry.  Psychiatric: She has a normal mood and affect. Her behavior is normal.  Nursing note and vitals reviewed.   ED Course  Procedures (including critical care time) Labs Review Labs Reviewed - No data to display  Imaging Review Dg Knee Complete 4 Views  Left  12/06/2014  CLINICAL DATA:  Status post fall 2 days ago. Pain behind the patella. EXAM: LEFT KNEE - COMPLETE 4+ VIEW COMPARISON:  None. FINDINGS: There is no evidence of fracture, dislocation, or joint effusion. There is no evidence of arthropathy or other focal bone abnormality. Soft tissues are unremarkable. IMPRESSION: No acute osseous injury of the left knee. Electronically Signed   By: Kathreen Devoid   On: 12/06/2014 11:23   I have personally reviewed and evaluated these images and lab results as part of my medical decision-making.  Patient will be placed in a knee immobilizer.  I will give her referral to orthopedics.  Told to return here as needed.  Ice and elevate the knee.  Keep the abrasion clean and dry    Dalia Heading, PA-C 12/06/14 1137  Debby Freiberg, MD 12/12/14 [phone]

## 2014-12-23 ENCOUNTER — Ambulatory Visit (INDEPENDENT_AMBULATORY_CARE_PROVIDER_SITE_OTHER): Payer: Medicaid Other | Admitting: Family Medicine

## 2014-12-23 ENCOUNTER — Telehealth: Payer: Self-pay

## 2014-12-23 ENCOUNTER — Encounter: Payer: Self-pay | Admitting: Family Medicine

## 2014-12-23 VITALS — BP 138/86 | HR 84 | Temp 97.8°F | Wt 182.0 lb

## 2014-12-23 DIAGNOSIS — S8990XA Unspecified injury of unspecified lower leg, initial encounter: Secondary | ICD-10-CM

## 2014-12-23 DIAGNOSIS — L304 Erythema intertrigo: Secondary | ICD-10-CM

## 2014-12-23 MED ORDER — NYSTATIN 100000 UNIT/GM EX POWD: CUTANEOUS | Status: DC

## 2014-12-23 NOTE — Progress Notes (Signed)
    Subjective: CC: rash HPI: Patient is a 54 y.o. female presenting to clinic today for same day appt. Concerns today include:  Rash Patient reports that rash started after she applied an expired hemorrhoid cream 3 days ago.  She notes that a rash appeared about 24 hours after application of cream.  It started on the inner thigh.  Patient notes that rash is itching and burning.  She has been using Neosporin with little relief.  She notes that the rash has been stable for days.  Has not used any other OTCs.  No new lotions, soaps, detergents, foods, clothing.  Social History Reviewed: non smoker. FamHx and MedHx updated.  Please see EMR. Health Maintenance: Declines flu shot  ROS: Per HPI  Objective: Office vital signs reviewed. BP 138/86 mmHg  Pulse 84  Temp(Src) 97.8 F (36.6 C) (Oral)  Wt 182 lb (82.555 kg)  SpO2 99%  LMP 07/06/2012  Physical Examination:  General: Awake, alert, obese, No acute distress HEENT: Normal, moist mucus membranes Skin: inguinal folds and underneath pannus very moist with moderate erythema.  No exudate or skin breakdown.  Rash extends to b/l inner gluteal fold.  Assessment/ Plan: 54 y.o. female   1. Intertrigo of the inguinal and infrabdominal folds.   Does not appear to be a contact/chemical dermatitis.  No maceration of skin.  NO evidence of secondary bacterial infection. - nystatin (MYCOSTATIN/NYSTOP) 100000 UNIT/GM POWD; Apply to affected areas 3 times daily until healed.  Dispense: 60 g; Refill: 1 - Could consider adding a topical steroid if not improving with antifungal - Discussed impact of obesity  - Patient to keep areas dry - Return precautions reviewed. - Recommend patient return in 1 week if worsening or NO improvement.  Janora Norlander, DO PGY-2, [place]

## 2014-12-23 NOTE — Patient Instructions (Signed)
I have sent in a prescription for an antifungal powder for you to apply 3 times daily until your rash is healed (probably the next 2-3 weeks).  If you notice NO improvement or worsening of rash in the next week, I recommend you return for evaluation. Intertrigo Intertrigo is a skin condition that occurs in between folds of skin in places on the body that rub together a lot and do not get much ventilation. It is caused by heat, moisture, friction, sweat retention, and lack of air circulation, which produces red, irritated patches and, sometimes, scaling or drainage. People who have diabetes, who are obese, or who have treatment with antibiotics are at increased risk for intertrigo. The most common sites for intertrigo to occur include:  The groin.  The breasts.  The armpits.  Folds of abdominal skin.  Webbed spaces between the fingers or toes. Intertrigo may be aggravated by:  Sweat.  Feces.  Yeast or bacteria that are present near skin folds.  Urine.  Vaginal discharge. HOME CARE INSTRUCTIONS  The following steps can be taken to reduce friction and keep the affected area cool and dry:  Expose skin folds to the air.  Keep deep skin folds separated with cotton or linen cloth. Avoid tight fitting clothing that could cause chafing.  Wear open-toed shoes or sandals to help reduce moisture between the toes.  Apply absorbent powders to affected areas as directed by your caregiver.  Apply over-the-counter barrier pastes, such as zinc oxide, as directed by your caregiver.  If you develop a fungal infection in the affected area, your caregiver may have you use antifungal creams. SEEK MEDICAL CARE IF:   The rash is not improving after 1 week of treatment.  The rash is getting worse (more red, more swollen, more painful, or spreading).  You have a fever or chills. MAKE SURE YOU:   Understand these instructions.  Will watch your condition.  Will get help right away if you are  not doing well or get worse.   This information is not intended to replace advice given to you by your health care provider. Make sure you discuss any questions you have with your health care provider.   Document Released: 02/01/2005 Document Revised: 04/26/2011 Document Reviewed: 08/05/2014 Elsevier Interactive Patient Education [employer].

## 2014-12-23 NOTE — Telephone Encounter (Signed)
Pt fell last week, injured knee.  ER dr said she needs to go to an orthopedist.  She called her orthopedist but she was told she would need to get a referral from her PCP before they can see her. Pt wants to know if you can do referral? We need to call her on her daughters phone, [phone] to let her know what was decided. Ottis Stain, CMA

## 2015-01-14 ENCOUNTER — Encounter (HOSPITAL_COMMUNITY): Payer: Self-pay | Admitting: Emergency Medicine

## 2015-01-14 ENCOUNTER — Emergency Department (HOSPITAL_COMMUNITY)
Admission: EM | Admit: 2015-01-14 | Discharge: 2015-01-14 | Disposition: A | Discharge status: Home / Self Care | Payer: Medicaid Other | Attending: Emergency Medicine | Admitting: Emergency Medicine

## 2015-01-14 ENCOUNTER — Emergency Department (HOSPITAL_COMMUNITY): Payer: Medicaid Other

## 2015-01-14 DIAGNOSIS — Z79899 Other long term (current) drug therapy: Secondary | ICD-10-CM | POA: Diagnosis not present

## 2015-01-14 DIAGNOSIS — Z791 Long term (current) use of non-steroidal anti-inflammatories (NSAID): Secondary | ICD-10-CM | POA: Insufficient documentation

## 2015-01-14 DIAGNOSIS — Z862 Personal history of diseases of the blood and blood-forming organs and certain disorders involving the immune mechanism: Secondary | ICD-10-CM | POA: Insufficient documentation

## 2015-01-14 DIAGNOSIS — R197 Diarrhea, unspecified: Secondary | ICD-10-CM | POA: Insufficient documentation

## 2015-01-14 DIAGNOSIS — M1990 Unspecified osteoarthritis, unspecified site: Secondary | ICD-10-CM | POA: Diagnosis not present

## 2015-01-14 DIAGNOSIS — Z8719 Personal history of other diseases of the digestive system: Secondary | ICD-10-CM | POA: Diagnosis not present

## 2015-01-14 DIAGNOSIS — Z7982 Long term (current) use of aspirin: Secondary | ICD-10-CM | POA: Insufficient documentation

## 2015-01-14 DIAGNOSIS — R509 Fever, unspecified: Secondary | ICD-10-CM | POA: Insufficient documentation

## 2015-01-14 DIAGNOSIS — I10 Essential (primary) hypertension: Secondary | ICD-10-CM | POA: Insufficient documentation

## 2015-01-14 DIAGNOSIS — Z87891 Personal history of nicotine dependence: Secondary | ICD-10-CM | POA: Diagnosis not present

## 2015-01-14 DIAGNOSIS — R079 Chest pain, unspecified: Secondary | ICD-10-CM | POA: Insufficient documentation

## 2015-01-14 DIAGNOSIS — M797 Fibromyalgia: Secondary | ICD-10-CM | POA: Insufficient documentation

## 2015-01-14 DIAGNOSIS — R05 Cough: Secondary | ICD-10-CM | POA: Diagnosis present

## 2015-01-14 DIAGNOSIS — R059 Cough, unspecified: Secondary | ICD-10-CM

## 2015-01-14 MED ORDER — ALBUTEROL SULFATE HFA 108 (90 BASE) MCG/ACT IN AERS
2.0000 | INHALATION_SPRAY | RESPIRATORY_TRACT | Status: DC | PRN
  Administered 2015-01-14: 2 via RESPIRATORY_TRACT
  Filled 2015-01-14: qty 6.7

## 2015-01-14 MED ORDER — BENZONATATE 100 MG PO CAPS: 200.0000 mg | ORAL_CAPSULE | Freq: Two times a day (BID) | ORAL | Status: DC | PRN

## 2015-01-14 MED ORDER — PREDNISONE 20 MG PO TABS: 40.0000 mg | ORAL_TABLET | Freq: Every day | ORAL | Status: DC

## 2015-01-14 MED ORDER — PREDNISONE 20 MG PO TABS
60.0000 mg | ORAL_TABLET | Freq: Once | ORAL | Status: AC
  Administered 2015-01-14: 60 mg via ORAL
  Filled 2015-01-14: qty 3

## 2015-01-14 NOTE — ED Notes (Signed)
Pt has had a productive cough for the past 2 weeks accompanied by generalized body aches. Began to have lower lateral rib pain 3 days ago after coughing.

## 2015-01-14 NOTE — ED Provider Notes (Signed)
CSN: [id number]     Arrival date & time 01/14/15  1216 History  By signing my name below, I, Erling Conte, attest that this documentation has been prepared under the direction and in the presence of Montine Circle, PA-C. Electronically Signed: Erling Conte, ED Scribe. 01/14/2015. 2:04 PM.    Chief Complaint  Patient presents with  . Cough    The history is provided by the patient. No language interpreter was used.    HPI Comments: Debbie Clarke is a 54 y.o. female who presents to the Emergency Department with a chief complaint of intermittent, moderate, productive cough onset 2 weeks. She reports associated generalized body aches, episodic diarrhea, mild fever and lower lateral rib pain. She states the rib pain is exacerbated with coughing. She took OTC Robitussin cough medicine with no significant relief. Pt denies any alleviating/aggravating factors. Pt denies any h/o asthma or COPD. She is non smoker. Pt denies any nausea or vomiting.  Past Medical History  Diagnosis Date  . Hypertension   . GERD (gastroesophageal reflux disease)   . Blood dyscrasia     sickle cell trait  . Fibromyalgia   . Arthritis   . Hx of sickle cell trait    Past Surgical History  Procedure Laterality Date  . Cesarean section  1985, 1998  . Anterior cervical decomp/discectomy fusion N/A 08/07/2012    Procedure: ANTERIOR CERVICAL DECOMPRESSION/DISCECTOMY FUSION 2 LEVELS;  Surgeon: Marybelle Killings, MD;  Location: [facility];  Service: Orthopedics;  Laterality: N/A;  C5-6, C6-7 ACD&F, ALLOGRAFT AND PLATES   History reviewed. No pertinent family history. Social History  Substance Use Topics  . Smoking status: Former Smoker -- 0.25 packs/day for 20 years    Types: Cigarettes    Quit date: 08/02/1998  . Smokeless tobacco: Never Used  . Alcohol Use: No   OB History    No data available     Review of Systems  Constitutional: Positive for fever.  Respiratory: Positive for cough.   Gastrointestinal:  Positive for diarrhea.  Musculoskeletal: Positive for myalgias.      Allergies  Review of patient's allergies indicates no known allergies.  Home Medications   Prior to Admission medications   Medication Sig Start Date End Date Taking? Authorizing Provider  asenapine (SAPHRIS) 5 MG SUBL 24 hr tablet Place 5 mg under the tongue 2 (two) times daily.    Historical Provider, MD  aspirin EC 81 MG tablet Take 1 tablet (81 mg total) by mouth daily. 10/07/14   Nicolette Bang, DO  HYDROcodone-acetaminophen (NORCO/VICODIN) 5-325 MG tablet Take 1 tablet by mouth every 6 (six) hours as needed for moderate pain. 12/06/14   Dalia Heading, PA-C  ibuprofen (ADVIL,MOTRIN) 800 MG tablet Take 1 tablet (800 mg total) by mouth every 8 (eight) hours as needed. 12/06/14   Christopher Lawyer, PA-C  losartan-hydrochlorothiazide (HYZAAR) 100-12.5 MG per tablet TAKE 1 TABLET BY MOUTH DAILY. 06/10/14   Coral Spikes, DO  meloxicam (MOBIC) 15 MG tablet Take 1 tablet (15 mg total) by mouth daily. 10/07/14   Nicolette Bang, DO  nystatin (MYCOSTATIN/NYSTOP) 100000 UNIT/GM POWD Apply to affected areas 3 times daily until healed. 12/23/14   Ashly Windell Moulding, DO  PARoxetine (PAXIL) 20 MG tablet Take 1 tablet by mouth daily. 09/24/14   Historical Provider, MD  venlafaxine XR (EFFEXOR-XR) 37.5 MG 24 hr capsule Take 1 capsule (37.5 mg total) by mouth daily with breakfast. 08/28/14   Nicolette Bang, DO   Triage Vitals:  BP 183/104 mmHg  Pulse 69  Temp(Src) 97.8 F (36.6 C) (Oral)  Resp 16  SpO2 100%  LMP 07/06/2012  Physical Exam  Constitutional: She is oriented to person, place, and time. She appears well-developed and well-nourished.  HENT:  Head: Normocephalic and atraumatic.  Eyes: Conjunctivae and EOM are normal. Pupils are equal, round, and reactive to light.  Neck: Normal range of motion. Neck supple.  Cardiovascular: Normal rate and regular rhythm.  Exam reveals no gallop and no  friction rub.   No murmur heard. Pulmonary/Chest: Effort normal and breath sounds normal. No respiratory distress. She has no wheezes. She has no rales. She exhibits no tenderness.  Abdominal: Soft. Bowel sounds are normal. She exhibits no distension and no mass. There is no tenderness. There is no rebound and no guarding.  Musculoskeletal: Normal range of motion. She exhibits no edema or tenderness.  Neurological: She is alert and oriented to person, place, and time.  Skin: Skin is warm and dry.  Psychiatric: She has a normal mood and affect. Her behavior is normal. Judgment and thought content normal.  Nursing note and vitals reviewed.   ED Course  Procedures (including critical care time)  DIAGNOSTIC STUDIES: Oxygen Saturation is 100% on RA, normal by my interpretation.    COORDINATION OF CARE: 12:29 PM- Will order CXR. Pt advised of plan for treatment and pt agrees.  2:05 PM- Recommended f/u with PCP regarding cardiomegaly found on CXR. Advised pt if she has persistent high fever, increased sputum production or changes in sputum color, to return to ER for repeat CXR. Will give rx for Tessalon pearls, albuterol inhaler, and prednisone. Pt advised of plan for treatment and pt agrees.  Labs Review Labs Reviewed - No data to display   Imaging Review Dg Chest 2 View  01/14/2015  CLINICAL DATA:  54 year old female with cough and phlegm production. Shortness of breath. Mid chest pain that radiates beneath the left breast for the past 2 weeks. EXAM: CHEST  2 VIEW COMPARISON:  Chest x-ray 04/27/2012. FINDINGS: Lung volumes are normal. No consolidative airspace disease. No pleural effusions. No evidence of pulmonary edema. Heart size is mildly enlarged. Upper mediastinal contours are within normal limits. Orthopedic fixation hardware in the lower cervical spine incidentally noted. IMPRESSION: 1. No radiographic evidence of acute cardiopulmonary disease. 2. Mild cardiomegaly. This appears to be  new compared to prior study 04/27/2012. Electronically Signed   By: Vinnie Langton M.D.   On: 01/14/2015 13:26   I have personally reviewed and evaluated these images as part of my medical decision-making.   MDM   Final diagnoses:  Cough    Pt CXR negative for acute infiltrate. Patients symptoms are consistent with URI, likely viral etiology. Discussed that antibiotics are not indicated for viral infections. Pt will be discharged with symptomatic treatment.  Verbalizes understanding and is agreeable with plan. Pt is hemodynamically stable & in NAD prior to dc.  I personally performed the services described in this documentation, which was scribed in my presence. The recorded information has been reviewed and is accurate.       Montine Circle, PA-C 01/14/15 Katie Liu, MD 01/14/15 [phone]

## 2015-01-14 NOTE — Discharge Instructions (Signed)
Please take your medications as prescribed.  You may be sick for another 7-10 days.  You need to return if you run a fever, have chest pain, shortness of breath, no changes in sputum.  Your chest x-ray showed no evidence of pneumonia, but did show cardiomegaly (enlarged heart).  Please follow-up with your doctor regarding this finding.  Sometimes this can be positional while taking the x-ray.  Cough, Adult Coughing is a reflex that clears your throat and your airways. Coughing helps to heal and protect your lungs. It is normal to cough occasionally, but a cough that happens with other symptoms or lasts a long time may be a sign of a condition that needs treatment. A cough may last only 2-3 weeks (acute), or it may last longer than 8 weeks (chronic). CAUSES Coughing is commonly caused by:  Breathing in substances that irritate your lungs.  A viral or bacterial respiratory infection.  Allergies.  Asthma.  Postnasal drip.  Smoking.  Acid backing up from the stomach into the esophagus (gastroesophageal reflux).  Certain medicines.  Chronic lung problems, including COPD (or rarely, lung cancer).  Other medical conditions such as heart failure. HOME CARE INSTRUCTIONS  Pay attention to any changes in your symptoms. Take these actions to help with your discomfort:  Take medicines only as told by your health care provider.  If you were prescribed an antibiotic medicine, take it as told by your health care provider. Do not stop taking the antibiotic even if you start to feel better.  Talk with your health care provider before you take a cough suppressant medicine.  Drink enough fluid to keep your urine clear or pale yellow.  If the air is dry, use a cold steam vaporizer or humidifier in your bedroom or your home to help loosen secretions.  Avoid anything that causes you to cough at work or at home.  If your cough is worse at night, try sleeping in a semi-upright position.  Avoid  cigarette smoke. If you smoke, quit smoking. If you need help quitting, ask your health care provider.  Avoid caffeine.  Avoid alcohol.  Rest as needed. SEEK MEDICAL CARE IF:   You have new symptoms.  You cough up pus.  Your cough does not get better after 2-3 weeks, or your cough gets worse.  You cannot control your cough with suppressant medicines and you are losing sleep.  You develop pain that is getting worse or pain that is not controlled with pain medicines.  You have a fever.  You have unexplained weight loss.  You have night sweats. SEEK IMMEDIATE MEDICAL CARE IF:  You cough up blood.  You have difficulty breathing.  Your heartbeat is very fast.   This information is not intended to replace advice given to you by your health care provider. Make sure you discuss any questions you have with your health care provider.   Document Released: 07/31/2010 Document Revised: 10/23/2014 Document Reviewed: 04/10/2014 Elsevier Interactive Patient Education [employer].

## 2015-01-17 ENCOUNTER — Encounter: Payer: Self-pay | Admitting: Internal Medicine

## 2015-01-17 ENCOUNTER — Ambulatory Visit (INDEPENDENT_AMBULATORY_CARE_PROVIDER_SITE_OTHER): Payer: Medicaid Other | Admitting: Internal Medicine

## 2015-01-17 VITALS — BP 167/88 | HR 67 | Temp 98.0°F | Ht 63.0 in | Wt 179.5 lb

## 2015-01-17 DIAGNOSIS — J069 Acute upper respiratory infection, unspecified: Secondary | ICD-10-CM | POA: Diagnosis not present

## 2015-01-17 DIAGNOSIS — I517 Cardiomegaly: Secondary | ICD-10-CM | POA: Diagnosis not present

## 2015-01-17 DIAGNOSIS — I5032 Chronic diastolic (congestive) heart failure: Secondary | ICD-10-CM | POA: Insufficient documentation

## 2015-01-17 DIAGNOSIS — I10 Essential (primary) hypertension: Secondary | ICD-10-CM | POA: Diagnosis not present

## 2015-01-17 NOTE — Assessment & Plan Note (Signed)
Patient hypertensive at today's visit. Was much more hypertensive at ED visit and had been without Losartan-HCTZ for approximately one week. ED refilled her medication and she began taking it yesterday. Since patient denied red flag symptoms, had been without her hypertension medication, and BP appears to be improving since ED visit, will not make any medication changes today. Patient has had well controlled BPs at past office visits this year. Will continue to monitor.

## 2015-01-17 NOTE — Assessment & Plan Note (Addendum)
Found on CXR from 01/14/15. Cardiomegaly read as new from previous CXR in March 2016. EKG from 2014 showed probable LVH. Patient was in RRR on exam today, so did not feel that repeat EKG was warranted today. Ordered echo to evaluate for CHF given patient's report of new-onset LE edema and SOB. Will follow up with patient regarding results. Instructed patient to call for worsening LE edema or anginal type chest pain.

## 2015-01-17 NOTE — Progress Notes (Signed)
Subjective:    Debbie Clarke - 54 y.o. female MRN [id number]  Date of birth: 1960/07/06  HPI  Debbie Clarke is 54 y.o. female who is here for ER follow up from 3 days ago. Was seen in ED for 2 weeks of intermittent, productive cough. Additionally complained of body aches, episodic diarrhea, mild fever, and rib pain 2/2 to cough. CXR was obtained and was negative for acute infiltrate. ER provider felt that symptoms were consistent with viral URI and did not prescribe an antibiotic. Recommended symptomatic care and prescribed 5 day course of prednisone and tessalon. Today, patient reports improvement of URI symptoms.   CXR did show cardiomegaly that was new compared to prior CXR in March 2016. Was instructed to follow up with PCP regarding this. Patient reports new LE edema for approximately 2 weeks. Reports SOB but this also started around the time of her cold symptoms. Denies angina type chest pain. Reports feeling occasional heart palpitations.  No headaches or changes in vision.   There are no preventive care reminders to display for this patient.  -  reports that she quit smoking about 16 years ago. Her smoking use included Cigarettes. She has a 5 pack-year smoking history. She has never used smokeless tobacco. - Review of Systems: Per HPI. - Past Medical History: Patient Active Problem List   Diagnosis Date Noted  . Cardiomegaly 01/17/2015  . URI (upper respiratory infection) 01/17/2015  . Auditory hallucinations 08/30/2013  . Diffuse pain 07/27/2013  . Low back pain 05/26/2013  . Hot flashes 05/26/2013  . Preventative health care 05/26/2013  . Spinal stenosis in cervical region 08/08/2012    Class: Diagnosis of  . HYPERTENSION, BENIGN ESSENTIAL 04/27/2010   - Medications: reviewed and updated Current Outpatient Prescriptions  Medication Sig Dispense Refill  . asenapine (SAPHRIS) 5 MG SUBL 24 hr tablet Place 5 mg under the tongue 2 (two) times daily.    Marland Kitchen aspirin EC 81 MG  tablet Take 1 tablet (81 mg total) by mouth daily. 30 tablet 11  . benzonatate (TESSALON) 100 MG capsule Take 2 capsules (200 mg total) by mouth 2 (two) times daily as needed for cough. 20 capsule 0  . HYDROcodone-acetaminophen (NORCO/VICODIN) 5-325 MG tablet Take 1 tablet by mouth every 6 (six) hours as needed for moderate pain. 15 tablet 0  . ibuprofen (ADVIL,MOTRIN) 800 MG tablet Take 1 tablet (800 mg total) by mouth every 8 (eight) hours as needed. 21 tablet 0  . losartan-hydrochlorothiazide (HYZAAR) 100-12.5 MG per tablet TAKE 1 TABLET BY MOUTH DAILY. 90 tablet 2  . meloxicam (MOBIC) 15 MG tablet Take 1 tablet (15 mg total) by mouth daily. 30 tablet 2  . nystatin (MYCOSTATIN/NYSTOP) 100000 UNIT/GM POWD Apply to affected areas 3 times daily until healed. 60 g 1  . PARoxetine (PAXIL) 20 MG tablet Take 1 tablet by mouth daily.  2  . predniSONE (DELTASONE) 20 MG tablet Take 2 tablets (40 mg total) by mouth daily. 10 tablet 0  . venlafaxine XR (EFFEXOR-XR) 37.5 MG 24 hr capsule Take 1 capsule (37.5 mg total) by mouth daily with breakfast. 30 capsule 3  . [DISCONTINUED] Olmesartan-Amlodipine-HCTZ (TRIBENZOR) 40-10-25 MG TABS Take 1 tablet by mouth daily.     No current facility-administered medications for this visit.     Review of Systems See HPI     Objective:   Physical Exam BP 167/88 mmHg  Pulse 67  Temp(Src) 98 F (36.7 C) (Oral)  Ht 5\' 3"  (1.6 m)  Wt 179 lb 8 oz (81.421 kg)  BMI 31.81 kg/m2  LMP 07/06/2012 Gen: NAD, alert, cooperative with exam, well-appearing CV: RRR, good S1/S2, no murmur Resp: CTABL, no wheezes, non-labored Ext: 1+ edema to ankles bilaterally, pedal pulses 2+  Neuro: no gross deficits.  Psych: good insight, alert and oriented     Assessment & Plan:   HYPERTENSION, BENIGN ESSENTIAL Patient hypertensive at today's visit. Was much more hypertensive at ED visit and had been without Losartan-HCTZ for approximately one week. ED refilled her medication and  she began taking it yesterday. Since patient denied red flag symptoms, had been without her hypertension medication, and BP appears to be improving since ED visit, will not make any medication changes today. Patient has had well controlled BPs at past office visits this year. Will continue to monitor.   Cardiomegaly Found on CXR from 01/14/15. Cardiomegaly read as new from previous CXR in March 2016. EKG from 2014 showed probable LVH. Patient was in RRR on exam today, so did not feel that repeat EKG was warranted today. Ordered echo to evaluate for CHF given patient's report of new-onset LE edema and SOB. Will follow up with patient regarding results. Instructed patient to call for worsening LE edema or anginal type chest pain.   URI (upper respiratory infection) Improved from ED visit 3 days ago. Exam benign. Continue with symptomatic care.      Phill Myron, D.O. 01/17/2015, 2:47 PM PGY-1, [place]

## 2015-01-17 NOTE — Assessment & Plan Note (Signed)
Improved from ED visit 3 days ago. Exam benign. Continue with symptomatic care.

## 2015-01-17 NOTE — Patient Instructions (Signed)
Thank you for coming to see me today. It was a pleasure. Today we talked about:   Your chest x-ray that showed an enlarged heart. Please go to your appointment for an Echo, which is an ultrasound of your heart. This will tell us if your heart is working well. I will call you with the results.   If you have any questions or concerns, please do not hesitate to call the office at [phone].  Take Care,   Phill Myron, DO

## 2015-01-28 ENCOUNTER — Encounter (HOSPITAL_COMMUNITY): Payer: Self-pay | Admitting: *Deleted

## 2015-01-28 ENCOUNTER — Emergency Department (HOSPITAL_COMMUNITY)
Admission: EM | Admit: 2015-01-28 | Discharge: 2015-01-28 | Discharge status: Against Medical Advice | Payer: Medicaid Other | Attending: Emergency Medicine | Admitting: Emergency Medicine

## 2015-01-28 DIAGNOSIS — K625 Hemorrhage of anus and rectum: Secondary | ICD-10-CM | POA: Insufficient documentation

## 2015-01-28 DIAGNOSIS — I10 Essential (primary) hypertension: Secondary | ICD-10-CM | POA: Diagnosis not present

## 2015-01-28 DIAGNOSIS — K6289 Other specified diseases of anus and rectum: Secondary | ICD-10-CM | POA: Diagnosis present

## 2015-01-28 NOTE — ED Notes (Signed)
Pt states she is leaving because she must catch the bus home.  She will try to come back in the morning

## 2015-01-28 NOTE — ED Notes (Signed)
Pt states she noticed blood in her stool yesterday morning and today. Pt states she noticed the blood in the toilet and on the toilet paper. Pt states she was constipated last week, took a laxative on Sunday and was able to have a bowel movement. Pt states that she has been more chocolate covered eating peanuts for the past 2 months, ~4 movie sized boxes within the past 2 weeks.

## 2015-01-29 ENCOUNTER — Other Ambulatory Visit: Payer: Self-pay

## 2015-01-29 ENCOUNTER — Ambulatory Visit (HOSPITAL_COMMUNITY): Payer: Medicaid Other | Attending: Cardiology

## 2015-01-29 DIAGNOSIS — Z87891 Personal history of nicotine dependence: Secondary | ICD-10-CM | POA: Insufficient documentation

## 2015-01-29 DIAGNOSIS — I340 Nonrheumatic mitral (valve) insufficiency: Secondary | ICD-10-CM | POA: Insufficient documentation

## 2015-01-29 DIAGNOSIS — I517 Cardiomegaly: Secondary | ICD-10-CM | POA: Diagnosis not present

## 2015-01-29 DIAGNOSIS — I10 Essential (primary) hypertension: Secondary | ICD-10-CM | POA: Diagnosis not present

## 2015-03-28 ENCOUNTER — Encounter (HOSPITAL_COMMUNITY): Payer: Self-pay | Admitting: Emergency Medicine

## 2015-03-28 ENCOUNTER — Emergency Department (HOSPITAL_COMMUNITY)
Admission: EM | Admit: 2015-03-28 | Discharge: 2015-03-28 | Disposition: A | Discharge status: Home / Self Care | Payer: Medicaid Other | Attending: Emergency Medicine | Admitting: Emergency Medicine

## 2015-03-28 ENCOUNTER — Emergency Department (HOSPITAL_COMMUNITY): Payer: Medicaid Other

## 2015-03-28 DIAGNOSIS — Z7982 Long term (current) use of aspirin: Secondary | ICD-10-CM | POA: Insufficient documentation

## 2015-03-28 DIAGNOSIS — Z8719 Personal history of other diseases of the digestive system: Secondary | ICD-10-CM | POA: Insufficient documentation

## 2015-03-28 DIAGNOSIS — Z7952 Long term (current) use of systemic steroids: Secondary | ICD-10-CM | POA: Diagnosis not present

## 2015-03-28 DIAGNOSIS — M1990 Unspecified osteoarthritis, unspecified site: Secondary | ICD-10-CM | POA: Insufficient documentation

## 2015-03-28 DIAGNOSIS — Z87828 Personal history of other (healed) physical injury and trauma: Secondary | ICD-10-CM | POA: Diagnosis not present

## 2015-03-28 DIAGNOSIS — Z862 Personal history of diseases of the blood and blood-forming organs and certain disorders involving the immune mechanism: Secondary | ICD-10-CM | POA: Diagnosis not present

## 2015-03-28 DIAGNOSIS — I10 Essential (primary) hypertension: Secondary | ICD-10-CM | POA: Insufficient documentation

## 2015-03-28 DIAGNOSIS — Z79899 Other long term (current) drug therapy: Secondary | ICD-10-CM | POA: Insufficient documentation

## 2015-03-28 DIAGNOSIS — M25512 Pain in left shoulder: Secondary | ICD-10-CM

## 2015-03-28 DIAGNOSIS — Z87891 Personal history of nicotine dependence: Secondary | ICD-10-CM | POA: Diagnosis not present

## 2015-03-28 MED ORDER — KETOROLAC TROMETHAMINE 60 MG/2ML IM SOLN
60.0000 mg | Freq: Once | INTRAMUSCULAR | Status: AC
  Administered 2015-03-28: 60 mg via INTRAMUSCULAR
  Filled 2015-03-28: qty 2

## 2015-03-28 MED ORDER — OXYCODONE-ACETAMINOPHEN 5-325 MG PO TABS: 2.0000 | ORAL_TABLET | ORAL | Status: DC | PRN

## 2015-03-28 NOTE — ED Notes (Signed)
Pt reports pain in l/shoulder x 3 months. Pt stated that she fell a few months ago. C/o increased pain over last two weeks

## 2015-03-28 NOTE — Discharge Instructions (Signed)
Cryotherapy Cryotherapy is when you put ice on your injury. Ice helps lessen pain and puffiness (swelling) after an injury. Ice works the best when you start using it in the first 24 to 48 hours after an injury. HOME CARE  Put a dry or damp towel between the ice pack and your skin.  You may press gently on the ice pack.  Leave the ice on for no more than 10 to 20 minutes at a time.  Check your skin after 5 minutes to make sure your skin is okay.  Rest at least 20 minutes between ice pack uses.  Stop using ice when your skin loses feeling (numbness).  Do not use ice on someone who cannot tell you when it hurts. This includes small children and people with memory problems (dementia). GET HELP RIGHT AWAY IF:  You have white spots on your skin.  Your skin turns blue or pale.  Your skin feels waxy or hard.  Your puffiness gets worse. MAKE SURE YOU:   Understand these instructions.  Will watch your condition.  Will get help right away if you are not doing well or get worse.   This information is not intended to replace advice given to you by your health care provider. Make sure you discuss any questions you have with your health care provider.   Document Released: 07/21/2007 Document Revised: 04/26/2011 Document Reviewed: 09/24/2010 Elsevier Interactive Patient Education 2016 [employer]  Shoulder Pain The shoulder is the joint that connects your arms to your body. The bones that form the shoulder joint include the upper arm bone (humerus), the shoulder blade (scapula), and the collarbone (clavicle). The top of the humerus is shaped like a ball and fits into a rather flat socket on the scapula (glenoid cavity). A combination of muscles and strong, fibrous tissues that connect muscles to bones (tendons) support your shoulder joint and hold the ball in the socket. Small, fluid-filled sacs (bursae) are located in different areas of the joint. They act as cushions between the bones  and the overlying soft tissues and help reduce friction between the gliding tendons and the bone as you move your arm. Your shoulder joint allows a wide range of motion in your arm. This range of motion allows you to do things like scratch your back or throw a ball. However, this range of motion also makes your shoulder more prone to pain from overuse and injury. Causes of shoulder pain can originate from both injury and overuse and usually can be grouped in the following four categories:  Redness, swelling, and pain (inflammation) of the tendon (tendinitis) or the bursae (bursitis).  Instability, such as a dislocation of the joint.  Inflammation of the joint (arthritis).  Broken bone (fracture). HOME CARE INSTRUCTIONS   Apply ice to the sore area.  Put ice in a plastic bag.  Place a towel between your skin and the bag.  Leave the ice on for 15-20 minutes, 3-4 times per day for the first 2 days, or as directed by your health care provider.  Stop using cold packs if they do not help with the pain.  If you have a shoulder sling or immobilizer, wear it as long as your caregiver instructs. Only remove it to shower or bathe. Move your arm as little as possible, but keep your hand moving to prevent swelling.  Squeeze a soft ball or foam pad as much as possible to help prevent swelling.  Only take over-the-counter or prescription medicines for  pain, discomfort, or fever as directed by your caregiver. SEEK MEDICAL CARE IF:   Your shoulder pain increases, or new pain develops in your arm, hand, or fingers.  Your hand or fingers become cold and numb.  Your pain is not relieved with medicines. SEEK IMMEDIATE MEDICAL CARE IF:   Your arm, hand, or fingers are numb or tingling.  Your arm, hand, or fingers are significantly swollen or turn white or blue. MAKE SURE YOU:   Understand these instructions.  Will watch your condition.  Will get help right away if you are not doing well or get  worse.   This information is not intended to replace advice given to you by your health care provider. Make sure you discuss any questions you have with your health care provider.   Follow-up with your primary care provider on Monday for reevaluation. Apply ice to affected area. Keep arm in sling. Take pain medication as needed. Return to the emergency department if you experience any worsening of your symptoms, numbness or chilling in both of your extremities, discoloration or swelling of your extremity, chest pain, shortness of breath.

## 2015-03-28 NOTE — ED Provider Notes (Signed)
CSN: [id number]     Arrival date & time 03/28/15  1419 History  By signing my name below, I, Rowan Blase, attest that this documentation has been prepared under the direction and in the presence of non-physician practitioner, Donnald Garre PA-C. Electronically Signed: Rowan Blase, Scribe. 03/28/2015. 5:09 PM.   Chief Complaint  Patient presents with  . Shoulder Pain    3 month hx of l/shoulder pain   The history is provided by the patient. No language interpreter was used.   HPI Comments:  ARAINA BOZEMAN is a 55 y.o. female with PMHx of HTN, fibromyalgia, and arthritis who presents to the Emergency Department complaining of gradual onset, moderate-severe left shoulder pain beginning three months ago, worsening over the last two weeks. Pt notes pain is keeping her up at night and she notices a lot of popping sounds in her left shoulder. Pt reports associated numbness radiating down left arm into fingers. She states she takes [place] powder with mild relief. Pt reports a fall in October 2016 when her legs gave out. She came to the ED for evaluation of shoulder and knee pain at that time and was told they couldn't x-ray her shoulder due to Medicare restrictions. She notes this pain is different from pain following earlier surgery. Pt reports seeing pain management once in August 2016 and denies signing a pain management contract; she reports receiving prescription pain medication in the ED at previous visits.  Past Medical History  Diagnosis Date  . Hypertension   . GERD (gastroesophageal reflux disease)   . Blood dyscrasia     sickle cell trait  . Fibromyalgia   . Arthritis   . Hx of sickle cell trait    Past Surgical History  Procedure Laterality Date  . Cesarean section  1985, 1998  . Anterior cervical decomp/discectomy fusion N/A 08/07/2012    Procedure: ANTERIOR CERVICAL DECOMPRESSION/DISCECTOMY FUSION 2 LEVELS;  Surgeon: Marybelle Killings, MD;  Location: [facility];  Service:  Orthopedics;  Laterality: N/A;  C5-6, C6-7 ACD&F, ALLOGRAFT AND PLATES   Family History  Problem Relation Age of Onset  . Hypertension Father   . Cancer Father    Social History  Substance Use Topics  . Smoking status: Former Smoker -- 0.25 packs/day for 20 years    Types: Cigarettes    Quit date: 08/02/1998  . Smokeless tobacco: Never Used  . Alcohol Use: No   OB History    No data available     Review of Systems  All other systems reviewed and are negative.   Allergies  Review of patient's allergies indicates no known allergies.  Home Medications   Prior to Admission medications   Medication Sig Start Date End Date Taking? Authorizing Provider  asenapine (SAPHRIS) 5 MG SUBL 24 hr tablet Place 5 mg under the tongue 2 (two) times daily.   Yes Historical Provider, MD  aspirin EC 81 MG tablet Take 1 tablet (81 mg total) by mouth daily. 10/07/14  Yes Nicolette Bang, DO  losartan-hydrochlorothiazide (HYZAAR) 100-12.5 MG per tablet TAKE 1 TABLET BY MOUTH DAILY. 06/10/14  Yes Coral Spikes, DO  PARoxetine (PAXIL) 20 MG tablet Take 1 tablet by mouth daily. 09/24/14  Yes Historical Provider, MD  predniSONE (DELTASONE) 20 MG tablet Take 2 tablets (40 mg total) by mouth daily. 01/14/15  Yes Montine Circle, PA-C  venlafaxine XR (EFFEXOR-XR) 37.5 MG 24 hr capsule Take 1 capsule (37.5 mg total) by mouth daily with breakfast. 08/28/14  Yes Barnetta Chapel  Michae Kava, DO  benzonatate (TESSALON) 100 MG capsule Take 2 capsules (200 mg total) by mouth 2 (two) times daily as needed for cough. 01/14/15   Montine Circle, PA-C  HYDROcodone-acetaminophen (NORCO/VICODIN) 5-325 MG tablet Take 1 tablet by mouth every 6 (six) hours as needed for moderate pain. Patient not taking: Reported on 03/28/2015 12/06/14   Dalia Heading, PA-C  ibuprofen (ADVIL,MOTRIN) 800 MG tablet Take 1 tablet (800 mg total) by mouth every 8 (eight) hours as needed. Patient not taking: Reported on 03/28/2015 12/06/14    Dalia Heading, PA-C  meloxicam (MOBIC) 15 MG tablet Take 1 tablet (15 mg total) by mouth daily. Patient not taking: Reported on 03/28/2015 10/07/14   Nicolette Bang, DO  nystatin (MYCOSTATIN/NYSTOP) 100000 UNIT/GM POWD Apply to affected areas 3 times daily until healed. Patient not taking: Reported on 03/28/2015 12/23/14   Ashly M Gottschalk, DO   BP 156/99 mmHg  Pulse 72  Temp(Src) 98.1 F (36.7 C) (Oral)  Resp 20  SpO2 100%  LMP 07/06/2012 Physical Exam  Constitutional: She is oriented to person, place, and time. She appears well-developed and well-nourished. No distress.  HENT:  Head: Normocephalic and atraumatic.  Eyes: Conjunctivae are normal. Right eye exhibits no discharge. Left eye exhibits no discharge. No scleral icterus.  Cardiovascular: Normal rate, normal heart sounds and intact distal pulses.   Pulmonary/Chest: Effort normal and breath sounds normal.  Musculoskeletal:  Negative hawkins test, negative Neer's test, mildTTP over left shoulder and AC joint. Pain felt with adduction and abduction. No obvious bony deformity.Intact distal pulses.     Neurological: She is alert and oriented to person, place, and time. Coordination normal.  Strength 5/5 throughout. No sensory deficits.    Skin: Skin is warm and dry. No rash noted. She is not diaphoretic. No erythema. No pallor.  Psychiatric: She has a normal mood and affect. Her behavior is normal.  Nursing note and vitals reviewed.  ED Course  Procedures  DIAGNOSTIC STUDIES:  Oxygen Saturation is 100% on RA, normal by my interpretation.    COORDINATION OF CARE:  5:04 PM Will administer a Toradol injection and Percocet prior to discharge. Discussed treatment plan with pt at bedside and pt agreed to plan.  Labs Review Labs Reviewed - No data to display  Imaging Review Dg Shoulder Left  03/28/2015  CLINICAL DATA:  Pain for 3 months following fall EXAM: LEFT SHOULDER - 2+ VIEW COMPARISON:  None. FINDINGS:  Frontal, Y scapular, and axillary images were obtained. There is no fracture or dislocation. Joint spaces appear intact. No erosive change or intra-articular calcification. There is postoperative change in the lower cervical spine region. The visualized left lung is clear. IMPRESSION: No demonstrable fracture or dislocation. No appreciable arthropathy. Electronically Signed   By: Lowella Grip III M.D.   On: 03/28/2015 17:23   I have personally reviewed and evaluated these images and lab results as part of my medical decision-making.   EKG Interpretation None     MDM   Final diagnoses:  Left shoulder pain   Patient X-Ray negative for obvious fracture or dislocation.  Pt advised to follow up with orthopedics. Patient given sling while in ED, conservative therapy recommended and discussed. Patient will be discharged home & is agreeable with above plan. Returns precautions discussed. Pt appears safe for discharge.   I personally performed the services described in this documentation, which was scribed in my presence. The recorded information has been reviewed and is accurate.     Aldona Bar  Jovita Kussmaul, PA-C 03/28/15 2225  Lacretia Leigh, MD 03/28/15 [id number]

## 2015-03-31 ENCOUNTER — Ambulatory Visit (INDEPENDENT_AMBULATORY_CARE_PROVIDER_SITE_OTHER): Payer: Medicaid Other | Admitting: Internal Medicine

## 2015-03-31 VITALS — BP 170/82 | HR 55 | Temp 97.8°F | Ht 63.0 in | Wt 186.5 lb

## 2015-03-31 DIAGNOSIS — M25512 Pain in left shoulder: Secondary | ICD-10-CM | POA: Diagnosis not present

## 2015-03-31 DIAGNOSIS — I10 Essential (primary) hypertension: Secondary | ICD-10-CM

## 2015-03-31 DIAGNOSIS — I5032 Chronic diastolic (congestive) heart failure: Secondary | ICD-10-CM | POA: Diagnosis present

## 2015-03-31 MED ORDER — DICLOFENAC SODIUM 75 MG PO TBEC: 75.0000 mg | DELAYED_RELEASE_TABLET | Freq: Two times a day (BID) | ORAL | Status: DC

## 2015-03-31 MED ORDER — AMLODIPINE BESYLATE 5 MG PO TABS: 5.0000 mg | ORAL_TABLET | Freq: Every day | ORAL | Status: DC

## 2015-03-31 MED ORDER — MELOXICAM 15 MG PO TABS: 15.0000 mg | ORAL_TABLET | Freq: Every day | ORAL | Status: DC

## 2015-03-31 NOTE — Assessment & Plan Note (Addendum)
Differential diagnosis includes rotator cuff tendinopathy vs. Rotator cuff tear vs. Shoulder impingement. Tendinopathy high on differential given pain with abduction, active arc test, and empty can test without associated weakness. Doubt fracture or joint pathology given recent negative Xray. Xray also not very concerning for OA as etiology of pain. Patient does have history of cervical spinal stenosis which could be contributing to her symptoms especially in light of reported numbness/tingling throughout the UE. Given long history of pain and chronic worsening, would like further evaluation of etiology.  -sports medicine referral placed  -trial of Voltaren given  -counseled patient to try to decrease amount of Goody Powders used  -ice to the area as tolerated  -patient unwilling to try Gabapentin again

## 2015-03-31 NOTE — Assessment & Plan Note (Signed)
Echo performed on 12/16: EF 65-70% with Grade 1 Diastolic Dysfunction.  -discussed echo results with patient  -focus on control of HTN

## 2015-03-31 NOTE — Progress Notes (Signed)
Subjective:    Debbie Clarke - 55 y.o. female MRN [id number]  Date of birth: 09/07/1960  HPI  Debbie Clarke is here for follow up for shoulder pain and to discuss echo results.  Shoulder Pain: Was seen in ED on 2/10 for left shoulder pain. X-ray was negative for fracture and dislocation. Shoulder pain has been present for 3 months. Reports pain has been worsening over the past few moths. Had a fall in October. Has numbness/tingling that radiates from shoulder down to fingertip. Pain keeps her awake at night. She takes Goody powders for pain management. Was given 8 pills of Percocet at the ED which she reports gave her some relief of pain. Has been afebrile.   HFpEF: Echo performed 12/16 due to cardiomegaly on CXR and new-onset SOB and LE edema. Today, no LE edema or complaints of SOB.    HTN: Reports compliance with her medications. No headaches, blurry vision, or edema.   Health Maintenance:  There are no preventive care reminders to display for this patient.  -  reports that she quit smoking about 16 years ago. Her smoking use included Cigarettes. She has a 5 pack-year smoking history. She has never used smokeless tobacco. - Review of Systems: Per HPI. - Past Medical History: Patient Active Problem List   Diagnosis Date Noted  . Shoulder pain, left 03/31/2015  . Chronic diastolic congestive heart failure ([place]) 01/17/2015  . URI (upper respiratory infection) 01/17/2015  . Auditory hallucinations 08/30/2013  . Diffuse pain 07/27/2013  . Low back pain 05/26/2013  . Hot flashes 05/26/2013  . Preventative health care 05/26/2013  . Spinal stenosis in cervical region 08/08/2012    Class: Diagnosis of  . HYPERTENSION, BENIGN ESSENTIAL 04/27/2010   - Medications: reviewed and updated Current Outpatient Prescriptions  Medication Sig Dispense Refill  . amLODipine (NORVASC) 5 MG tablet Take 1 tablet (5 mg total) by mouth daily. 30 tablet 2  . asenapine (SAPHRIS) 5 MG SUBL 24 hr  tablet Place 5 mg under the tongue 2 (two) times daily.    Marland Kitchen aspirin EC 81 MG tablet Take 1 tablet (81 mg total) by mouth daily. 30 tablet 11  . benzonatate (TESSALON) 100 MG capsule Take 2 capsules (200 mg total) by mouth 2 (two) times daily as needed for cough. 20 capsule 0  . diclofenac (VOLTAREN) 75 MG EC tablet Take 1 tablet (75 mg total) by mouth 2 (two) times daily. 60 tablet 0  . HYDROcodone-acetaminophen (NORCO/VICODIN) 5-325 MG tablet Take 1 tablet by mouth every 6 (six) hours as needed for moderate pain. (Patient not taking: Reported on 03/28/2015) 15 tablet 0  . ibuprofen (ADVIL,MOTRIN) 800 MG tablet Take 1 tablet (800 mg total) by mouth every 8 (eight) hours as needed. (Patient not taking: Reported on 03/28/2015) 21 tablet 0  . losartan-hydrochlorothiazide (HYZAAR) 100-12.5 MG per tablet TAKE 1 TABLET BY MOUTH DAILY. 90 tablet 2  . meloxicam (MOBIC) 15 MG tablet Take 1 tablet (15 mg total) by mouth daily. (Patient not taking: Reported on 03/28/2015) 30 tablet 2  . meloxicam (MOBIC) 15 MG tablet Take 1 tablet (15 mg total) by mouth daily. 30 tablet 0  . nystatin (MYCOSTATIN/NYSTOP) 100000 UNIT/GM POWD Apply to affected areas 3 times daily until healed. (Patient not taking: Reported on 03/28/2015) 60 g 1  . oxyCODONE-acetaminophen (PERCOCET/ROXICET) 5-325 MG tablet Take 2 tablets by mouth every 4 (four) hours as needed for severe pain. 8 tablet 0  . PARoxetine (PAXIL) 20 MG tablet  Take 1 tablet by mouth daily.  2  . predniSONE (DELTASONE) 20 MG tablet Take 2 tablets (40 mg total) by mouth daily. 10 tablet 0  . venlafaxine XR (EFFEXOR-XR) 37.5 MG 24 hr capsule Take 1 capsule (37.5 mg total) by mouth daily with breakfast. 30 capsule 3  . [DISCONTINUED] Olmesartan-Amlodipine-HCTZ (TRIBENZOR) 40-10-25 MG TABS Take 1 tablet by mouth daily.     No current facility-administered medications for this visit.      Objective:   Physical Exam BP 170/82 mmHg  Pulse 55  Temp(Src) 97.8 F (36.6 C)  (Oral)  Ht 5\' 3"  (1.6 m)  Wt 186 lb 8 oz (84.596 kg)  BMI 33.05 kg/m2  LMP 07/06/2012 Gen: NAD, alert, cooperative with exam, well-appearing but tearful  CV: RRR, good S1/S2, no murmur, no edema, 2+ radial pulses  Resp: CTABL, no wheezes, non-labored MSK: No erythema or edema over left shoulder. Tenderness to palpation over left AC joint but no obvious joint deformity. Decreased active and passive ROM in abduction of left shoulder joint. Painful arc test. Pain with empty can test but no weakness.  Neuro: sensation of upper extremities grossly intact. Strength 5/5 in UE bilaterally.       Assessment & Plan:   HYPERTENSION, BENIGN ESSENTIAL Patient with uncontrolled HTN at today's visit on repeat measurements. Patient reports good compliance with medications. Suspect that HTN might be worsened by pain, but give degree of uncontrolled HTN will add additional therapy.  -start amlodipine 5 mg daily  -continue losartan-HCTZ 100-12.5 mg  -patient to return for nurse BP check in 2 weeks to assess effectiveness of new therapy   Shoulder pain, left Differential diagnosis includes rotator cuff tendinopathy vs. Rotator cuff tear vs. Shoulder impingement. Tendinopathy high on differential given pain with abduction, active arc test, and empty can test without associated weakness. Doubt fracture or joint pathology given recent negative Xray. Xray also not very concerning for OA as etiology of pain. Patient does have history of cervical spinal stenosis which could be contributing to her symptoms especially in light of reported numbness/tingling throughout the UE. Given long history of pain and chronic worsening, would like further evaluation of etiology.  -sports medicine referral placed  -trial of Voltaren given  -counseled patient to try to decrease amount of Goody Powders used  -ice to the area as tolerated  -patient unwilling to try Gabapentin again   Chronic diastolic congestive heart failure  ([place]) Echo performed on 12/16: EF 65-70% with Grade 1 Diastolic Dysfunction.  -discussed echo results with patient  -focus on control of HTN      Phill Myron, D.O. 03/31/2015, 8:22 PM PGY-1, [place]

## 2015-03-31 NOTE — Assessment & Plan Note (Signed)
Patient with uncontrolled HTN at today's visit on repeat measurements. Patient reports good compliance with medications. Suspect that HTN might be worsened by pain, but give degree of uncontrolled HTN will add additional therapy.  -start amlodipine 5 mg daily  -continue losartan-HCTZ 100-12.5 mg  -patient to return for nurse BP check in 2 weeks to assess effectiveness of new therapy

## 2015-03-31 NOTE — Patient Instructions (Signed)
Take the Voltaren for your shoulder pain. You can try ice to the area as well. We will be calling you with a Sports Medicine appointment. Take the Amlodipine 5 mg for your high blood pressure. Please make a nursing visit to be seen in 2 weeks for a recheck of your blood pressure. Continue to take your blood pressure medication that you already have at home.   Take Care,   Dr. Juleen China

## 2015-04-11 ENCOUNTER — Encounter: Payer: Self-pay | Admitting: Family Medicine

## 2015-04-11 ENCOUNTER — Ambulatory Visit (INDEPENDENT_AMBULATORY_CARE_PROVIDER_SITE_OTHER): Payer: Medicaid Other | Admitting: Family Medicine

## 2015-04-11 VITALS — BP 145/75 | Ht 63.0 in | Wt 186.0 lb

## 2015-04-11 DIAGNOSIS — M25512 Pain in left shoulder: Secondary | ICD-10-CM | POA: Diagnosis not present

## 2015-04-11 DIAGNOSIS — M25532 Pain in left wrist: Secondary | ICD-10-CM | POA: Diagnosis not present

## 2015-04-11 DIAGNOSIS — M4802 Spinal stenosis, cervical region: Secondary | ICD-10-CM | POA: Diagnosis not present

## 2015-04-11 MED ORDER — PREDNISONE 20 MG PO TABS: 40.0000 mg | ORAL_TABLET | Freq: Two times a day (BID) | ORAL | Status: DC

## 2015-04-11 MED ORDER — CYCLOBENZAPRINE HCL 5 MG PO TABS: 5.0000 mg | ORAL_TABLET | Freq: Two times a day (BID) | ORAL | Status: DC | PRN

## 2015-04-14 ENCOUNTER — Encounter: Payer: Self-pay | Admitting: Internal Medicine

## 2015-04-14 ENCOUNTER — Ambulatory Visit (INDEPENDENT_AMBULATORY_CARE_PROVIDER_SITE_OTHER): Payer: Medicaid Other | Admitting: Internal Medicine

## 2015-04-14 ENCOUNTER — Other Ambulatory Visit: Payer: Self-pay | Admitting: Internal Medicine

## 2015-04-14 VITALS — BP 129/79 | HR 74 | Temp 97.9°F | Wt 185.3 lb

## 2015-04-14 DIAGNOSIS — G4700 Insomnia, unspecified: Secondary | ICD-10-CM | POA: Insufficient documentation

## 2015-04-14 DIAGNOSIS — I10 Essential (primary) hypertension: Secondary | ICD-10-CM

## 2015-04-14 DIAGNOSIS — R52 Pain, unspecified: Secondary | ICD-10-CM

## 2015-04-14 DIAGNOSIS — G894 Chronic pain syndrome: Secondary | ICD-10-CM

## 2015-04-14 DIAGNOSIS — L304 Erythema intertrigo: Secondary | ICD-10-CM | POA: Diagnosis not present

## 2015-04-14 MED ORDER — NYSTATIN 100000 UNIT/GM EX POWD: CUTANEOUS | Status: DC

## 2015-04-14 MED ORDER — MELATONIN 3 MG PO TABS: 3.0000 mg | ORAL_TABLET | Freq: Every day | ORAL | Status: DC

## 2015-04-14 NOTE — Telephone Encounter (Signed)
LVM for pt to call the office. Please inform her of the below information. Ottis Stain, CMA

## 2015-04-14 NOTE — Telephone Encounter (Signed)
Pt here and forgot to mention during visit that she needs a refill on Nystatin 100000 Unit/GM Powd, sent to [facility] on [address]. Sadie Reynolds, ASA

## 2015-04-14 NOTE — Assessment & Plan Note (Signed)
Controlled at today's visit.  -continue amlodipine 5 mg and losartan-HCTZ 100-12.5 mg  -continue to monitor at future visits

## 2015-04-14 NOTE — Assessment & Plan Note (Signed)
Patient with very altered sleep schedule. Suspect some of her difficulty sleeping secondary to chronic pain/fibromyalgia.  -discussed importance of establishing sleep routine with consistent bedtime and awakening time, no screen time 1 hour prior to bed -if she is unable to fall asleep within 30 minutes of lying down she should get up out of bed and try something relaxing  -trial of melatonin prior to bedtime -discouraged napping during the day

## 2015-04-14 NOTE — Progress Notes (Signed)
Subjective:    Debbie Clarke - 55 y.o. female MRN [id number]  Date of birth: Apr 15, 1960  HPI  Debbie Clarke is here for follow up of her chronic medical conditions.  HTN: Reports good compliance with Amlodipine 5 mg started at last office visit 2 weeks ago. Initially, had some dizziness that first couple days when taking medications but this has since resolved. Denies chest pain, vision changes, or LE edema.    Chronic Pain: Continues to have diffuse significant pain in UE, neck, and back. She was seen by Sports medicine recently who were unable to pinpoint a specific anatomical cause of her pain. She reports that she did not take the prednisone or flexeril prescribed for her. She has continued to use Goody powders. Smokes marijuana to help control pain. Reports continued frustration over being unable to have a pain management appointment. States that she was able to abstain from [facility] use for over 30 days in the fall but that every time she goes for a pain clinic appointment they are running 3-4 hours behind their appointment slots and she is never able to wait that long.   Insomnia: Reports that she is having difficulty sleeping at night. She will lie in bed from 11-4 am before finally falling asleep. Estimates that she averages about 3 hours of sleep per night. She states that she can fall asleep easily during the day when sitting down somewhere.   -  reports that she quit smoking about 16 years ago. Her smoking use included Cigarettes. She has a 5 pack-year smoking history. She has never used smokeless tobacco. - Review of Systems: Per HPI. - Past Medical History: Patient Active Problem List   Diagnosis Date Noted  . Insomnia 04/14/2015  . Shoulder pain, left 03/31/2015  . Chronic diastolic congestive heart failure ([place]) 01/17/2015  . URI (upper respiratory infection) 01/17/2015  . Auditory hallucinations 08/30/2013  . Diffuse pain 07/27/2013  . Low back pain 05/26/2013  . Hot  flashes 05/26/2013  . Preventative health care 05/26/2013  . Spinal stenosis in cervical region 08/08/2012    Class: Diagnosis of  . HYPERTENSION, BENIGN ESSENTIAL 04/27/2010   - Medications: reviewed and updated Current Outpatient Prescriptions  Medication Sig Dispense Refill  . amLODipine (NORVASC) 5 MG tablet Take 1 tablet (5 mg total) by mouth daily. 30 tablet 2  . asenapine (SAPHRIS) 5 MG SUBL 24 hr tablet Place 5 mg under the tongue 2 (two) times daily.    Marland Kitchen aspirin EC 81 MG tablet Take 1 tablet (81 mg total) by mouth daily. 30 tablet 11  . benzonatate (TESSALON) 100 MG capsule Take 2 capsules (200 mg total) by mouth 2 (two) times daily as needed for cough. 20 capsule 0  . cyclobenzaprine (FLEXERIL) 5 MG tablet Take 1 tablet (5 mg total) by mouth 2 (two) times daily as needed for muscle spasms. 30 tablet 1  . diclofenac (VOLTAREN) 75 MG EC tablet Take 1 tablet (75 mg total) by mouth 2 (two) times daily. 60 tablet 0  . HYDROcodone-acetaminophen (NORCO/VICODIN) 5-325 MG tablet Take 1 tablet by mouth every 6 (six) hours as needed for moderate pain. (Patient not taking: Reported on 03/28/2015) 15 tablet 0  . ibuprofen (ADVIL,MOTRIN) 800 MG tablet Take 1 tablet (800 mg total) by mouth every 8 (eight) hours as needed. (Patient not taking: Reported on 03/28/2015) 21 tablet 0  . losartan-hydrochlorothiazide (HYZAAR) 100-12.5 MG per tablet TAKE 1 TABLET BY MOUTH DAILY. 90 tablet 2  .  meloxicam (MOBIC) 15 MG tablet Take 1 tablet (15 mg total) by mouth daily. (Patient not taking: Reported on 03/28/2015) 30 tablet 2  . meloxicam (MOBIC) 15 MG tablet Take 1 tablet (15 mg total) by mouth daily. 30 tablet 0  . nystatin (MYCOSTATIN/NYSTOP) 100000 UNIT/GM POWD Apply to affected areas 3 times daily until healed. 60 g 1  . oxyCODONE-acetaminophen (PERCOCET/ROXICET) 5-325 MG tablet Take 2 tablets by mouth every 4 (four) hours as needed for severe pain. 8 tablet 0  . PARoxetine (PAXIL) 20 MG tablet Take 1 tablet  by mouth daily.  2  . predniSONE (DELTASONE) 20 MG tablet Take 2 tablets (40 mg total) by mouth 2 (two) times daily with a meal. 10 tablet 0  . venlafaxine XR (EFFEXOR-XR) 37.5 MG 24 hr capsule Take 1 capsule (37.5 mg total) by mouth daily with breakfast. 30 capsule 3  . [DISCONTINUED] Olmesartan-Amlodipine-HCTZ (TRIBENZOR) 40-10-25 MG TABS Take 1 tablet by mouth daily.     No current facility-administered medications for this visit.         Objective:   Physical Exam BP 129/79 mmHg  Pulse 74  Temp(Src) 97.9 F (36.6 C) (Oral)  Wt 185 lb 4.8 oz (84.052 kg)  LMP 07/06/2012 Gen: NAD, alert, cooperative with exam, well-appearing CV: RRR, good S1/S2, no murmur, no edema, capillary refill brisk  Resp: CTABL, no wheezes, non-labored Psych: good insight, alert and oriented     Assessment & Plan:   Diffuse pain Patient continuously with changing complaints of pain location. Sports medicine unable to pinpoint an anatomical cause of her pain on exam. Patient does have a h/o spinal stenosis with an ACDF of C5-C7 in June 2014. She has been diagnosed with fibromyalgia in the past. She has attempted Gabapentin in the past with no relief and is not interested in attempting this again. Reports no past relief of muscular pain with Flexeril. The only improvement of her pain has been with THC use and Goody Powders in the past. She desires to attempt a referral with another pain management clinic.  -pain management referral placed today -patient currently on Effexor and Paxil managed by [facility]. Would like to consider switching Paxil to Cymbalta for attempt of better control of neuropathic pain. Will send [facility] a letter discussing possibility of change. Patient is agreeable to this and has given me permission to send this letter  -patient to f/u with Sports Medicine in march  -f/u with neurosurgeon scheduled for May  -continued encouragement of gentle stretching exercises and walking   HYPERTENSION,  BENIGN ESSENTIAL Controlled at today's visit.  -continue amlodipine 5 mg and losartan-HCTZ 100-12.5 mg  -continue to monitor at future visits   Insomnia Patient with very altered sleep schedule. Suspect some of her difficulty sleeping secondary to chronic pain/fibromyalgia.  -discussed importance of establishing sleep routine with consistent bedtime and awakening time, no screen time 1 hour prior to bed -if she is unable to fall asleep within 30 minutes of lying down she should get up out of bed and try something relaxing  -trial of melatonin prior to bedtime -discouraged napping during the day      Phill Myron, D.O. 04/14/2015, 12:30 PM PGY-1, [place]

## 2015-04-14 NOTE — Patient Instructions (Signed)
Try taking the Melatonin 30 minutes prior to when you would like to fall asleep. Try working on establishing a bedtime routine and keep a consistent bed time and time of getting up each day. Don't watch TV or use any screens less than an hour prior to bedtime.   I will send the letter to [facility] about switching your medications.   You will get a call about scheduling your pain clinic appointment.   Please call if you have any questions.   Take Care,   Dr. Juleen China

## 2015-04-14 NOTE — Progress Notes (Signed)
BREIGH Clarke - 55 y.o. female MRN [id number]  Date of birth: 1960-06-29  CC: Left shoulder and arm pain  SUBJECTIVE:   HPI Mrs. Debbie Clarke and is a very pleasant 55 year old female with multiple comorbidities listed below who presents with persistent left shoulder pain since October 2016, 4 months ago. She states that at that time she lost her balance and fell onto the outside of her shoulder. She injured her knee and that incident and initially that was much worse but as that is improved her shoulder pain has persisted. She denies having any shoulder pain prior to that fall. In the last couple weeks the shoulder pain has been increasing in intensity. They're currently feels like a deep throbbing. Her range of motion of that shoulder is severely limited. In the last few days however her shoulder pain has been improving and now she has left forearm pain. She states that she was trying to pick up a for 2 days ago and fell out of her hand. She has not had any similar incidents to this but does have known spinal stenosis with an ACDF of C5-C7 in June 2014. She denies any other episodes of losing dexterity in her upper extremities or significant weakness. She has fibromyalgia and is unsure how much of her chronic migrating pain is secondary to this. She is not very active at this point and no she has not used her left upper extremity very often due to the shoulder pain. She does wonder if her left forearm pain is secondary to disuse. She follows up with Dr. Lorin Mercy in about 3 months in regard to her cervical spine. She denies any new electrical sensation from her neck. She denies any loss of bowel or bladder control.  ROS:     As above, denies any fevers, chills, night sweats. Denies any dizziness. Patient states she has a constant throbbing throughout her body at all times, this is chronic.  HISTORY: Past Medical, Surgical, Social, and Family History Reviewed & Updated per EMR.  Pertinent Historical Findings  include: Hypertension, chronic diastolic congestive heart failure, spinal stenosis of the cervical spine, reported fibromyalgia, reflex, sickle cell trait. History of an ACDF of C5-C7 in 2014 by Dr. Lorin Mercy  Data review: Images reviewed from 03/28/2015. No significant findings.  OBJECTIVE: BP 145/75 mmHg  Ht 5\' 3"  (1.6 m)  Wt 186 lb (84.369 kg)  BMI 32.96 kg/m2  LMP 07/06/2012  Physical Exam  Calm, no acute distress Nonlabored breathing  Neck: Negative spurling's for radicular symptoms Limited neck range of motion Grip strength and sensation normal in bilateral hands Strength good C4 to T1 distribution No sensory change to C4 to T1 Reflexes normal  Shoulder exam is difficult due to tenderness in her forearm which makes stabilizing and performing several maneuvers difficult Shoulder: Left Inspection reveals no abnormalities, atrophy or asymmetry. Tender to palpation over multiple areas of the shoulder. Most notably posterior region including the trap, rhomboids, and infraspinatus and supraspinatus.  ROM is full in all planes compared to the contralateral side. She can get to 130 with forward flexion and about 110 with abduction. She is able to reach the lateral aspect of her hip with internal rotation. She gets to 50 with external rotation Rotator cuff strength normal throughout. Positive Neer and inconsistently positive Hawkins. Negative empty can Speeds and Yergason's tests inconsistently abnormal. No apprehension sign  Imaging: [place] image of the left in both long and short axis obtained. Biceps tendon appears normal fibrillar pattern a small  surrounding effusion. Subscapularis appears normal without obvious tears or abnormalities. The supraspinatus tendon appears normal with a possible articular sided tear, partial..  The infraspinatus and teres minor tendons appear normal with no tears and a good footprints. The subdeltoid/subacromial bursa is unremarkable and shows no  impingement with dynamic motion. The [facility] joint appears to have ample joint space and minimal spurring or effusion is present. Overall relatively unremarkable ultrasound.  Patient has significant tenderness throughout the forearm, particularly the dorsal aspect. She is tenderness over the wrist and pain with resisted extension of the wrist and fingers. Sensation is equal bilaterally. Intrinsic hand muscle and remainder of upper extremity strength remains normal.  MEDICATIONS, LABS & OTHER ORDERS: Previous Medications   AMLODIPINE (NORVASC) 5 MG TABLET    Take 1 tablet (5 mg total) by mouth daily.   ASENAPINE (SAPHRIS) 5 MG SUBL 24 HR TABLET    Place 5 mg under the tongue 2 (two) times daily.   ASPIRIN EC 81 MG TABLET    Take 1 tablet (81 mg total) by mouth daily.   BENZONATATE (TESSALON) 100 MG CAPSULE    Take 2 capsules (200 mg total) by mouth 2 (two) times daily as needed for cough.   DICLOFENAC (VOLTAREN) 75 MG EC TABLET    Take 1 tablet (75 mg total) by mouth 2 (two) times daily.   HYDROCODONE-ACETAMINOPHEN (NORCO/VICODIN) 5-325 MG TABLET    Take 1 tablet by mouth every 6 (six) hours as needed for moderate pain.   IBUPROFEN (ADVIL,MOTRIN) 800 MG TABLET    Take 1 tablet (800 mg total) by mouth every 8 (eight) hours as needed.   LOSARTAN-HYDROCHLOROTHIAZIDE (HYZAAR) 100-12.5 MG PER TABLET    TAKE 1 TABLET BY MOUTH DAILY.   MELOXICAM (MOBIC) 15 MG TABLET    Take 1 tablet (15 mg total) by mouth daily.   MELOXICAM (MOBIC) 15 MG TABLET    Take 1 tablet (15 mg total) by mouth daily.   NYSTATIN (MYCOSTATIN/NYSTOP) 100000 UNIT/GM POWD    Apply to affected areas 3 times daily until healed.   OXYCODONE-ACETAMINOPHEN (PERCOCET/ROXICET) 5-325 MG TABLET    Take 2 tablets by mouth every 4 (four) hours as needed for severe pain.   PAROXETINE (PAXIL) 20 MG TABLET    Take 1 tablet by mouth daily.   VENLAFAXINE XR (EFFEXOR-XR) 37.5 MG 24 HR CAPSULE    Take 1 capsule (37.5 mg total) by mouth daily with breakfast.     Modified Medications   Modified Medication Previous Medication   PREDNISONE (DELTASONE) 20 MG TABLET predniSONE (DELTASONE) 20 MG tablet      Take 2 tablets (40 mg total) by mouth 2 (two) times daily with a meal.    Take 2 tablets (40 mg total) by mouth daily.   New Prescriptions   CYCLOBENZAPRINE (FLEXERIL) 5 MG TABLET    Take 1 tablet (5 mg total) by mouth 2 (two) times daily as needed for muscle spasms.   Discontinued Medications   No medications on file  No orders of the defined types were placed in this encounter.   ASSESSMENT & PLAN: Left upper extremity pain: Mrs. Bellina had a very difficult exam to perform today. She fell 4 months ago onto her left shoulder. Since that time she has had persistent shoulder pain up until several days before her visit here. Nothing has helped. Recently she started having forearm pain on the left side and her shoulder pain has improved. Her exam is relatively unremarkable in regards to her  shoulder. She had a small articular sided supraspinatus tear on ultrasound but her exam is quite normal. Her forearm tenderness is not consistent with any specific pathology. She also is having trap and scapular stabilizers tenderness. Considering the diffuse nature and migratory pattern of her pain is difficult to sign one anatomic injury for her. Notably she does have a history of cervical spine disease and follows up with Dr. Inda Merlin in May 2017. We will try 5 days of prednisone, 40 mg per day. We have also provided her with Flexeril on an as-needed basis to help with some of the spasming, especially in the evening. We discussed the sedating properties of Flexeril. We will see her back in 3 weeks. Hopefully some of the confounding variables and her exam will have resolved. We provided her with gentle range of motion exercises for her left shoulder. We asked her to stop all NSAIDs while taking the prednisone.

## 2015-04-14 NOTE — Telephone Encounter (Signed)
Sent Melatonin script to pharmacy and asked them to help patient make selection. Please let her know. Thanks.   Phill Myron, D.O. 04/14/2015, 2:01 PM PGY-1, [place]

## 2015-04-14 NOTE — Telephone Encounter (Signed)
Pt is calling back because she was told that the doctor was going to call in medication that helps her sleep. Please let know what the status of this is. Debbie Clarke

## 2015-04-14 NOTE — Assessment & Plan Note (Signed)
Patient continuously with changing complaints of pain location. Sports medicine unable to pinpoint an anatomical cause of her pain on exam. Patient does have a h/o spinal stenosis with an ACDF of C5-C7 in June 2014. She has been diagnosed with fibromyalgia in the past. She has attempted Gabapentin in the past with no relief and is not interested in attempting this again. Reports no past relief of muscular pain with Flexeril. The only improvement of her pain has been with THC use and Goody Powders in the past. She desires to attempt a referral with another pain management clinic.  -pain management referral placed today -patient currently on Effexor and Paxil managed by [facility]. Would like to consider switching Paxil to Cymbalta for attempt of better control of neuropathic pain. Will send [facility] a letter discussing possibility of change. Patient is agreeable to this and has given me permission to send this letter  -patient to f/u with Sports Medicine in march  -f/u with neurosurgeon scheduled for May  -continued encouragement of gentle stretching exercises and walking

## 2015-04-16 NOTE — Telephone Encounter (Signed)
Contacted pt and she stated that she had already been informed. Katharina Caper, April D, [place]

## 2015-05-02 ENCOUNTER — Ambulatory Visit: Payer: Medicaid Other | Admitting: Family Medicine

## 2015-05-04 ENCOUNTER — Other Ambulatory Visit: Payer: Self-pay | Admitting: Internal Medicine

## 2015-05-05 MED ORDER — LOSARTAN POTASSIUM-HCTZ 100-12.5 MG PO TABS: 1.0000 | ORAL_TABLET | Freq: Every day | ORAL | Status: DC

## 2015-05-13 ENCOUNTER — Other Ambulatory Visit: Payer: Self-pay | Admitting: Internal Medicine

## 2015-05-20 ENCOUNTER — Telehealth: Payer: Self-pay | Admitting: Internal Medicine

## 2015-05-20 NOTE — Telephone Encounter (Signed)
I see I have an appointment with her on Thursday 4/6. We can discuss her pain at that time. If she feels like this is more acute, she should be seen in a SDA tomorrow. Please call patient to discuss options. Thanks.

## 2015-05-20 NOTE — Telephone Encounter (Signed)
Patient requesting rx for pain.

## 2015-05-21 NOTE — Telephone Encounter (Signed)
LVM for pt. If she calls ask her if she needs to be seen today or if she thinks she can wait for her appointment tomorrow. Ottis Stain, CMA

## 2015-05-22 ENCOUNTER — Ambulatory Visit (INDEPENDENT_AMBULATORY_CARE_PROVIDER_SITE_OTHER): Payer: Medicaid Other | Admitting: Internal Medicine

## 2015-05-22 ENCOUNTER — Encounter: Payer: Self-pay | Admitting: Internal Medicine

## 2015-05-22 VITALS — BP 186/112 | HR 74 | Temp 98.1°F | Ht 63.0 in | Wt 179.6 lb

## 2015-05-22 DIAGNOSIS — M2550 Pain in unspecified joint: Secondary | ICD-10-CM

## 2015-05-22 DIAGNOSIS — M7660 Achilles tendinitis, unspecified leg: Secondary | ICD-10-CM

## 2015-05-22 DIAGNOSIS — M67879 Other specified disorders of synovium and tendon, unspecified ankle and foot: Secondary | ICD-10-CM

## 2015-05-22 DIAGNOSIS — I10 Essential (primary) hypertension: Secondary | ICD-10-CM | POA: Diagnosis not present

## 2015-05-22 DIAGNOSIS — R52 Pain, unspecified: Secondary | ICD-10-CM | POA: Diagnosis not present

## 2015-05-22 LAB — C-REACTIVE PROTEIN: CRP: 0.7 mg/dL — AB (ref <0.60)

## 2015-05-22 NOTE — Assessment & Plan Note (Addendum)
Uncontrolled. Suspect related to poor medication compliance in the last few days. No red flags on history.  -encouraged patient to fill amlodipine and losartan-HCTZ today  -will monitor at next visit and likely check CMET

## 2015-05-22 NOTE — Patient Instructions (Signed)
Please go get your medications filled, especially your blood pressure medications! I will call you about lab results and Xray results.   Please make an appointment for the next couple weeks where we will go over your medications. Bring EVERY single medication you take to this visit.   For your ankle pain, apply ice, use Ace wrap, and keep it elevated.   Take Care,   Dr. Juleen China

## 2015-05-22 NOTE — Assessment & Plan Note (Signed)
Of right tendon. Suspect tendonitis or sprain. Given ability to ambulate and preserved strength, low suspicion for rupture or tear. No signs/symptoms to suggest DVT.  -treat conservatively with ice, tylenol, ace bandage, and elevation  -follow up if not improving

## 2015-05-22 NOTE — Assessment & Plan Note (Addendum)
Patient with continued complaints of diffuse pain. Sent letter at last office visit to [facility] to discuss potential switch of Paxil to Cymbalta for attempt at better control of neuropathic pain. Patient reports [facility] changed her medication regimen but she is unsure of the changes made. A lot of her upper extremity complaints do seem consistent with h/o cervical spinal stenosis. It is possible that her diffuse pain could be a result of a rheumatologic condition and patient has never had a work up for this etiology. Suspect bony deformity of fifth fingers may be related to OA but would like imaging to confirm/rule out other potential causes.  -xray of each hand ordered  -ANA, ESR, CRP ordered  -patient to return within the next few weeks for medication reconciliation appointment  -recommend f/u with neurosurgeon regarding cervical stenosis

## 2015-05-22 NOTE — Progress Notes (Signed)
Subjective:    Debbie Clarke - 55 y.o. female MRN [id number]  Date of birth: January 05, 1961  HPI  Debbie Clarke is here for follow up of pain.  Pain: Patient reports a variety of complaints with chronic pain. States her shoulders cause her a lot of pain. She is also concerned about numbness in her hands bilaterally after C-spine surgery almost 3 years ago. Also reports that her fingertips occasionally turn white. Notes that just this morning she dropped a brand new jar of vinegar even though she was holding it with both hands. Reports common problem of hand grip being too weak to hold things. Concerned about bony changes to her pinky fingers bilaterally.   Acutely, reports some right ankle pain. Reports swelling on and off in the back of the ankle. Pain worsened with walking. Denies injury or trauma to the area.   HTN: BP elevated at today's visit. Reports she has not taken her BP medications for 3 days. She has refills at the pharmacy but has not gone to pick them up. Denies headaches, chest pain, SOB or blurry vision.    -  reports that she quit smoking about 16 years ago. Her smoking use included Cigarettes. She has a 5 pack-year smoking history. She has never used smokeless tobacco. - Review of Systems: Per HPI. - Past Medical History: Patient Active Problem List   Diagnosis Date Noted  . Achilles tendon pain 05/22/2015  . Insomnia 04/14/2015  . Shoulder pain, left 03/31/2015  . Chronic diastolic congestive heart failure ([place]) 01/17/2015  . URI (upper respiratory infection) 01/17/2015  . Auditory hallucinations 08/30/2013  . Diffuse pain 07/27/2013  . Low back pain 05/26/2013  . Hot flashes 05/26/2013  . Preventative health care 05/26/2013  . Spinal stenosis in cervical region 08/08/2012    Class: Diagnosis of  . HYPERTENSION, BENIGN ESSENTIAL 04/27/2010   - Medications: reviewed and updated Current Outpatient Prescriptions  Medication Sig Dispense Refill  . amLODipine  (NORVASC) 5 MG tablet Take 1 tablet (5 mg total) by mouth daily. 30 tablet 2  . asenapine (SAPHRIS) 5 MG SUBL 24 hr tablet Place 5 mg under the tongue 2 (two) times daily.    Marland Kitchen aspirin EC 81 MG tablet Take 1 tablet (81 mg total) by mouth daily. 30 tablet 11  . benzonatate (TESSALON) 100 MG capsule Take 2 capsules (200 mg total) by mouth 2 (two) times daily as needed for cough. 20 capsule 0  . cyclobenzaprine (FLEXERIL) 5 MG tablet Take 1 tablet (5 mg total) by mouth 2 (two) times daily as needed for muscle spasms. 30 tablet 1  . diclofenac (VOLTAREN) 75 MG EC tablet TAKE 1 TABLET (75 MG TOTAL) BY MOUTH 2 (TWO) TIMES DAILY. 60 tablet 0  . HYDROcodone-acetaminophen (NORCO/VICODIN) 5-325 MG tablet Take 1 tablet by mouth every 6 (six) hours as needed for moderate pain. (Patient not taking: Reported on 03/28/2015) 15 tablet 0  . ibuprofen (ADVIL,MOTRIN) 800 MG tablet Take 1 tablet (800 mg total) by mouth every 8 (eight) hours as needed. (Patient not taking: Reported on 03/28/2015) 21 tablet 0  . losartan-hydrochlorothiazide (HYZAAR) 100-12.5 MG tablet Take 1 tablet by mouth daily. 90 tablet 2  . Melatonin 3 MG TABS Take 1 tablet (3 mg total) by mouth at bedtime. OTC please help patient make a selection 30 tablet 0  . meloxicam (MOBIC) 15 MG tablet Take 1 tablet (15 mg total) by mouth daily. (Patient not taking: Reported on 03/28/2015) 30 tablet 2  .  meloxicam (MOBIC) 15 MG tablet Take 1 tablet (15 mg total) by mouth daily. 30 tablet 0  . nystatin (MYCOSTATIN/NYSTOP) 100000 UNIT/GM POWD Apply to affected areas 3 times daily until healed. 60 g 1  . oxyCODONE-acetaminophen (PERCOCET/ROXICET) 5-325 MG tablet Take 2 tablets by mouth every 4 (four) hours as needed for severe pain. 8 tablet 0  . PARoxetine (PAXIL) 20 MG tablet Take 1 tablet by mouth daily.  2  . predniSONE (DELTASONE) 20 MG tablet Take 2 tablets (40 mg total) by mouth 2 (two) times daily with a meal. 10 tablet 0  . venlafaxine XR (EFFEXOR-XR) 37.5  MG 24 hr capsule TAKE 1 CAPSULE (37.5 MG TOTAL) BY MOUTH DAILY WITH BREAKFAST. 30 capsule 3  . [DISCONTINUED] Olmesartan-Amlodipine-HCTZ (TRIBENZOR) 40-10-25 MG TABS Take 1 tablet by mouth daily.     No current facility-administered medications for this visit.       Objective:   Physical Exam BP 186/112 mmHg  Pulse 74  Temp(Src) 98.1 F (36.7 C) (Oral)  Ht '5\' 3"'$  (1.6 m)  Wt 179 lb 9.6 oz (81.466 kg)  BMI 31.82 kg/m2  LMP 07/06/2012 Gen: NAD, alert, cooperative with exam Eyes: Small subconjunctival hemorrhage present in left eye  CV: RRR, good S1/S2, no murmur Resp: CTABL, no wheezes, non-labored MSK: Full ROM of LE bilaterally. ROM chronically diminished in neck. Bony deformity of PIP joint in fifth fingers bilaterally. Mild edema of the achilles tendon on right compared to left. Calves non-tender to palpation. Negative Homan's sign on right.  Neuro: Grip strength 4-5/5. Strength 5/5 in plantar/dorsiflexion and knee flexion/extension.      Assessment & Plan:   Achilles tendon pain Of right tendon. Suspect tendonitis or sprain. Given ability to ambulate and preserved strength, low suspicion for rupture or tear. No signs/symptoms to suggest DVT.  -treat conservatively with ice, tylenol, ace bandage, and elevation  -follow up if not improving  HYPERTENSION, BENIGN ESSENTIAL Uncontrolled. Suspect related to poor medication compliance in the last few days. No red flags on history.  -encouraged patient to fill amlodipine and losartan-HCTZ today  -will monitor at next visit and likely check CMET   Diffuse pain Patient with continued complaints of diffuse pain. Sent letter at last office visit to [facility] to discuss potential switch of Paxil to Cymbalta for attempt at better control of neuropathic pain. Patient reports [facility] changed her medication regimen but she is unsure of the changes made. A lot of her upper extremity complaints do seem consistent with h/o cervical spinal stenosis.  It is possible that her diffuse pain could be a result of a rheumatologic condition and patient has never had a work up for this etiology. Suspect bony deformity of fifth fingers may be related to OA but would like imaging to confirm/rule out other potential causes.  -xray of each hand ordered  -ANA, ESR, CRP ordered  -patient to return within the next few weeks for medication reconciliation appointment  -recommend f/u with neurosurgeon      Phill Myron, D.O. 05/22/2015, 6:50 PM PGY-1, [place]

## 2015-05-23 ENCOUNTER — Ambulatory Visit
Admission: RE | Admit: 2015-05-23 | Discharge: 2015-05-23 | Disposition: A | Discharge status: Home / Self Care | Payer: Medicaid Other | Source: Ambulatory Visit | Attending: Family Medicine | Admitting: Family Medicine

## 2015-05-23 DIAGNOSIS — M2550 Pain in unspecified joint: Secondary | ICD-10-CM

## 2015-05-23 LAB — ANA: Anti Nuclear Antibody(ANA): NEGATIVE

## 2015-05-23 LAB — SEDIMENTATION RATE: SED RATE: 22 mm/h (ref 0–30)

## 2015-05-25 ENCOUNTER — Other Ambulatory Visit: Payer: Self-pay | Admitting: Internal Medicine

## 2015-06-04 ENCOUNTER — Telehealth: Payer: Self-pay | Admitting: *Deleted

## 2015-06-04 NOTE — Telephone Encounter (Signed)
LVM for pt to call back to inform her of below. Zimmerman Rumple, Terrel Manalo D, CMA  

## 2015-06-04 NOTE — Telephone Encounter (Signed)
-----   Message from Nicolette Bang, DO sent at 06/04/2015  3:30 PM EDT ----- Please call patient and let her know that her lab results for a rheumatologic condition were negative. Her x-ray of hands showed mild osteoarthritis, the type of arthritis associated with aging that affects bones. If she has questions I will be happy to call her or discuss at her next visit.   Phill Myron, D.O. 06/04/2015, 3:30 PM PGY-1, [place]

## 2015-06-05 NOTE — Telephone Encounter (Signed)
-----   Message from Nicolette Bang, DO sent at 06/04/2015  3:30 PM EDT ----- Please call patient and let her know that her lab results for a rheumatologic condition were negative. Her x-ray of hands showed mild osteoarthritis, the type of arthritis associated with aging that affects bones. If she has questions I will be happy to call her or discuss at her next visit.   Phill Myron, D.O. 06/04/2015, 3:30 PM PGY-1, [place]

## 2015-06-05 NOTE — Telephone Encounter (Signed)
Pt returned April's call. I informed pt of the information below. She was happy with the results. Ottis Stain, CMA

## 2015-06-12 ENCOUNTER — Other Ambulatory Visit: Payer: Self-pay | Admitting: *Deleted

## 2015-06-12 ENCOUNTER — Encounter: Payer: Self-pay | Admitting: Internal Medicine

## 2015-06-12 ENCOUNTER — Ambulatory Visit (INDEPENDENT_AMBULATORY_CARE_PROVIDER_SITE_OTHER): Payer: Medicaid Other | Admitting: Internal Medicine

## 2015-06-12 VITALS — BP 142/88 | HR 81 | Temp 98.3°F | Ht 63.0 in | Wt 180.0 lb

## 2015-06-12 DIAGNOSIS — I10 Essential (primary) hypertension: Secondary | ICD-10-CM

## 2015-06-12 DIAGNOSIS — G894 Chronic pain syndrome: Secondary | ICD-10-CM

## 2015-06-12 DIAGNOSIS — R52 Pain, unspecified: Secondary | ICD-10-CM

## 2015-06-12 MED ORDER — VENLAFAXINE HCL ER 75 MG PO CP24: 75.0000 mg | ORAL_CAPSULE | Freq: Every day | ORAL | Status: DC

## 2015-06-12 MED ORDER — LOSARTAN POTASSIUM-HCTZ 100-12.5 MG PO TABS: 1.0000 | ORAL_TABLET | Freq: Every day | ORAL | Status: DC

## 2015-06-12 MED ORDER — AMLODIPINE BESYLATE 5 MG PO TABS: 5.0000 mg | ORAL_TABLET | Freq: Every day | ORAL | Status: DC

## 2015-06-12 NOTE — Progress Notes (Signed)
   Subjective:    PASTY LIPFORD - 55 y.o. female MRN [id number]  Date of birth: 27-Jan-1961  HPI  Miryam D Nguyen is here for medication review/management for chronic medical conditions.  HTN: Has been compliant with medical therapy. Currently taking Losartan-HCTZ and Amlodipine. Denies chest pain, headache, LE edema, and visual changes. Needs refills for both medications.   Diffuse, chronic pain: Pain control continues to be an issue for Ms. Fuente. She is not interested in narcotic medications. Currently taking Diclofenac. Uses Melatonin at night to help with sleep (sleep issues 2/2 pain). Denies ever trying Amitriptyline.   Psych: Managed by [employer]. Currently taking Effexor and Saphris.    -  reports that she quit smoking about 16 years ago. Her smoking use included Cigarettes. She has a 5 pack-year smoking history. She has never used smokeless tobacco. - Review of Systems: Per HPI. - Past Medical History: Patient Active Problem List   Diagnosis Date Noted  . Achilles tendon pain 05/22/2015  . Insomnia 04/14/2015  . Shoulder pain, left 03/31/2015  . Chronic diastolic congestive heart failure ([place]) 01/17/2015  . Auditory hallucinations 08/30/2013  . Diffuse pain 07/27/2013  . Low back pain 05/26/2013  . Hot flashes 05/26/2013  . Spinal stenosis in cervical region 08/08/2012    Class: Diagnosis of  . HYPERTENSION, BENIGN ESSENTIAL 04/27/2010   - Medications: reviewed and updated    Objective:   Physical Exam BP 142/88 mmHg  Pulse 81  Temp(Src) 98.3 F (36.8 C) (Oral)  Ht 5\' 3"  (1.6 m)  Wt 180 lb (81.647 kg)  BMI 31.89 kg/m2  SpO2 98%  LMP 07/06/2012 Gen: NAD, alert, cooperative with exam, well-appearing MSK: No joint swelling, erythema or edema. No focal findings.  Neuro: no gross deficits.  Psych: good insight, alert and oriented     Assessment & Plan:   HYPERTENSION, BENIGN ESSENTIAL Well controlled.  -continue amlodipine and losartan-HCTZ   Diffuse  pain Pain control continues to be a significant issue in patient's life. Discussed case with pharmacy Valentina Lucks) who recommended trial of increasing Effexor prior to initiation of TCA therapy.  -will increase Effexor to 75 mg daily  -instructed patient to show AVS at [facility] visit today regarding medication increase  -patient to return within 1 month to assess pain in regards to change in therapy  -consider trial of TCA in the future, have asked patient to discuss with [facility] about possible addition of that medication in light of her other psychiatric medications  -consider PM&R referral in the future  -could consider flexeril again      Phill Myron, D.O. 06/12/2015, 4:08 PM PGY-1, [place]

## 2015-06-12 NOTE — Patient Instructions (Signed)
Debbie Clarke,   It was great to see you today. Thank you for bringing me in all of your medications!  Things to Discuss at [facility]:  -We have doubled Effexor (Venalafaxine) dose to 75 mg to try to help with chronic pain/fibromyalgia -If increasing dose of Effexor does not help with pain, would like to consider a trial of Amitriptyline. Would this be a problem with therapies Ms. Peart is already on?   Take Care,   Dr. Juleen China

## 2015-06-12 NOTE — Assessment & Plan Note (Signed)
Well controlled.  -continue amlodipine and losartan-HCTZ

## 2015-06-12 NOTE — Assessment & Plan Note (Addendum)
Pain control continues to be a significant issue in patient's life. Discussed case with pharmacy Valentina Lucks) who recommended trial of increasing Effexor prior to initiation of TCA therapy.  -will increase Effexor to 75 mg daily  -instructed patient to show AVS at [facility] visit today regarding medication increase  -patient to return within 1 month to assess pain in regards to change in therapy  -consider trial of TCA in the future, have asked patient to discuss with [facility] about possible addition of that medication in light of her other psychiatric medications  -consider PM&R referral in the future  -could consider flexeril again

## 2015-08-18 ENCOUNTER — Telehealth: Payer: Self-pay | Admitting: Family Medicine

## 2015-08-18 NOTE — Telephone Encounter (Signed)
Called patient left message for her to call to discuss her attending the [facility] clinic on 7/12.    Asked her to leave a number and time where I can reach her  Debbie Clarke

## 2015-08-20 NOTE — Telephone Encounter (Signed)
Pt is returning Dr. Erin Hearing call from 08/18/15. She said that she can be reached at [phone]. jw

## 2015-08-22 NOTE — Telephone Encounter (Signed)
  Spoke with her and made appointment for 7/12

## 2015-08-27 ENCOUNTER — Encounter: Payer: Self-pay | Admitting: Internal Medicine

## 2015-08-27 ENCOUNTER — Ambulatory Visit (INDEPENDENT_AMBULATORY_CARE_PROVIDER_SITE_OTHER): Payer: Medicaid Other | Admitting: Internal Medicine

## 2015-08-27 VITALS — BP 187/108 | HR 81 | Temp 97.6°F | Ht 63.0 in | Wt 169.0 lb

## 2015-08-27 DIAGNOSIS — R52 Pain, unspecified: Secondary | ICD-10-CM

## 2015-08-27 DIAGNOSIS — F99 Mental disorder, not otherwise specified: Secondary | ICD-10-CM

## 2015-08-28 ENCOUNTER — Encounter: Payer: Self-pay | Admitting: Internal Medicine

## 2015-08-28 DIAGNOSIS — F99 Mental disorder, not otherwise specified: Secondary | ICD-10-CM | POA: Insufficient documentation

## 2015-08-28 MED ORDER — RANITIDINE HCL 150 MG PO TABS: 150.0000 mg | ORAL_TABLET | Freq: Every day | ORAL | Status: DC

## 2015-08-28 NOTE — Progress Notes (Signed)
Subjective:    Debbie Clarke - 55 y.o. female MRN [id number]  Date of birth: 1960-12-29  HPI  Debbie Clarke is here for [facility] visit. Social history was discussed and updated in the EMR. Please see social history tab for further information.   Chronic Diffuse Pain: Has suffered from chronic pain since her 73s and was told in the past that she has Fibromyalgia. Reports that pain significantly worsened in the past 3-4 years after her surgery for cervical stenosis and with worsening OA as she gets older. Currently, her shoulders and feet are bothering her the most. Reports continuing to use THC and Goody powders for pain relief. Worries about legal ramifications of marijuana use and stomach issues with Goody powder use but they seem to be the most effective pain relief strategies for her. Is still taking Diclofenac at home. Reserves these remedies for when she is really hurting. Has tried Gabapentin, Flexeril, and other NSAIDs in the past without significant improvement in her pain. Has noticed some improvement with Excedrin use. Still does PT exercises at home because they do help. Keeps a walking chart at home to measure her success in terms of activity level. Short term goals are some relief of pain. Long term goals are to get back to more activities she used to do earlier in her life (swimming, bowling, fishing, driving etc).  States that she is not interested in narcotic use because she has seen others go down bad paths with that.    Mental Health Disorder: Reports she had a "mental break down" in [id number] and police referred her to [facility]. She was seen there and then subsequently stopped going for a few years. Resumed care with them in 2015. Reports diagnosis of Schizophrenia and Bipolar Disorder. Reports difficulties with her psychiatrist at [facility] but has a very good relationship with her therapist there. Believes she is taking Saphris and Effexor 75 mg but did not bring her medications to today's  visit.    -  reports that she quit smoking about 17 years ago. Her smoking use included Cigarettes. She has a 5 pack-year smoking history. She has never used smokeless tobacco. - Review of Systems: Per HPI. - Past Medical History: Patient Active Problem List   Diagnosis Date Noted  . Mental health disorder 08/28/2015  . Achilles tendon pain 05/22/2015  . Insomnia 04/14/2015  . Shoulder pain, left 03/31/2015  . Chronic diastolic congestive heart failure ([place]) 01/17/2015  . Auditory hallucinations 08/30/2013  . Diffuse pain 07/27/2013  . Low back pain 05/26/2013  . Hot flashes 05/26/2013  . Spinal stenosis in cervical region 08/08/2012    Class: Diagnosis of  . HYPERTENSION, BENIGN ESSENTIAL 04/27/2010   - Medications: reviewed and updated    Objective:   Physical Exam BP 187/108 mmHg  Pulse 81  Temp(Src) 97.6 F (36.4 C) (Oral)  Ht 5\' 3"  (1.6 m)  Wt 169 lb (76.658 kg)  BMI 29.94 kg/m2  LMP 07/06/2012 Gen: NAD, alert, cooperative with exam Resp: No increased WOB.  Neuro: no gross deficits. Walking appears uncomfortable but gait otherwise normal.  Psych: good insight, alert and oriented, participates well in conversation      Assessment & Plan:   Diffuse pain Patient will likely suffer from waxing and waning pain throughout her lifetime. Will strive to keep pain as under control as possible and to develop coping strategies to get through acute episodes of pain.  -as Excedrin and Goody powders have worked for patient have  discussed continued sparing use of these methods, will prescribe H2 blocker for stomach protection and could consider increasing to PPI in future if needed  -permissive use of THC  -topic NSAID in future for focal episodes of pain  -could consider gabapentin in large dose at bedtime  -TCA discussion with [facility]  -at future visits will work to encourage slow return to previous physical activities and further investigation as to why she was told to limit  things such as driving   Mental health disorder Likely related to her diagnosis of fibromyalgia. Have tried to contact [facility] previously without success regarding patient's medical care and current medication regimen.  -would like to consider TCA in the future  -letter given to patient to give to [facility] psychiatrist at her visit tomorrow with my contact information and request to discuss patient's care      Phill Myron, D.O. 08/28/2015, 6:02 PM PGY-2, [place]

## 2015-08-28 NOTE — Assessment & Plan Note (Signed)
Likely related to her diagnosis of fibromyalgia. Have tried to contact [facility] previously without success regarding patient's medical care and current medication regimen.  -would like to consider TCA in the future  -letter given to patient to give to [facility] psychiatrist at her visit tomorrow with my contact information and request to discuss patient's care

## 2015-08-28 NOTE — Assessment & Plan Note (Signed)
Patient will likely suffer from waxing and waning pain throughout her lifetime. Will strive to keep pain as under control as possible and to develop coping strategies to get through acute episodes of pain.  -as Excedrin and Goody powders have worked for patient have discussed continued sparing use of these methods, will prescribe H2 blocker for stomach protection and could consider increasing to PPI in future if needed  -permissive use of THC  -topic NSAID in future for focal episodes of pain  -could consider gabapentin in large dose at bedtime  -TCA discussion with [facility]  -at future visits will work to encourage slow return to previous physical activities and further investigation as to why she was told to limit things such as driving

## 2015-10-07 ENCOUNTER — Other Ambulatory Visit: Payer: Self-pay | Admitting: Internal Medicine

## 2015-10-07 DIAGNOSIS — Z1231 Encounter for screening mammogram for malignant neoplasm of breast: Secondary | ICD-10-CM

## 2015-10-16 ENCOUNTER — Ambulatory Visit: Payer: Medicaid Other

## 2015-10-28 ENCOUNTER — Ambulatory Visit
Admission: RE | Admit: 2015-10-28 | Discharge: 2015-10-28 | Disposition: A | Discharge status: Home / Self Care | Payer: Medicaid Other | Source: Ambulatory Visit | Attending: Family Medicine | Admitting: Family Medicine

## 2015-10-28 DIAGNOSIS — Z1231 Encounter for screening mammogram for malignant neoplasm of breast: Secondary | ICD-10-CM

## 2015-10-30 ENCOUNTER — Other Ambulatory Visit: Payer: Self-pay | Admitting: Family Medicine

## 2015-10-30 DIAGNOSIS — R928 Other abnormal and inconclusive findings on diagnostic imaging of breast: Secondary | ICD-10-CM

## 2015-11-05 ENCOUNTER — Telehealth: Payer: Self-pay | Admitting: Internal Medicine

## 2015-11-05 NOTE — Telephone Encounter (Signed)
Pt called requesting an appt.  She states she has pains all in her body, brusing that she cant explain and her feet are swelling. She was offered an appt 12-02-15 to talk to Dr Juleen China about all these issues. She was not not happy about having to wait that long. She was offered a same day appt to address one issue but she said she didn't want to come to the dr for only one thing.  She reqeusted a note to be sent to dr Juleen China.  Please call the patient to let her know when dr wallace can see her.

## 2015-11-05 NOTE — Telephone Encounter (Signed)
Dr. Juleen China, you have a couple of same day appts available on 11/18/2015. Please advise on what you would like to do. Ottis Stain, CMA

## 2015-11-13 ENCOUNTER — Other Ambulatory Visit: Payer: Self-pay

## 2015-11-13 ENCOUNTER — Other Ambulatory Visit: Payer: Self-pay | Admitting: Family Medicine

## 2015-11-13 DIAGNOSIS — R928 Other abnormal and inconclusive findings on diagnostic imaging of breast: Secondary | ICD-10-CM

## 2015-11-14 ENCOUNTER — Ambulatory Visit
Admission: RE | Admit: 2015-11-14 | Discharge: 2015-11-14 | Disposition: A | Discharge status: Home / Self Care | Payer: Medicaid Other | Source: Ambulatory Visit | Attending: Family Medicine | Admitting: Family Medicine

## 2015-11-14 DIAGNOSIS — R928 Other abnormal and inconclusive findings on diagnostic imaging of breast: Secondary | ICD-10-CM

## 2015-11-19 ENCOUNTER — Telehealth: Payer: Self-pay | Admitting: Internal Medicine

## 2015-11-19 NOTE — Telephone Encounter (Signed)
Jasmine from social services needs a letter stating the pt needs to cool air for pt's hypertension. Social services is trying to pay a bill for the pt and needs a letter of medical necessity. Fax number [phone] Attn: Conley Simmonds. Please advise. Thanks!ep

## 2015-11-21 ENCOUNTER — Encounter: Payer: Self-pay | Admitting: Internal Medicine

## 2015-11-21 NOTE — Telephone Encounter (Signed)
I have completed a letter for DSS and routed it to the admin pool. Please fax to  [phone] Attn: Conley Simmonds.  Phill Myron, D.O. 11/21/2015, 12:16 PM PGY-2, [place]

## 2015-11-21 NOTE — Progress Notes (Signed)
I have completed a letter for DSS and routed it to the admin pool. Please fax to  [phone] Attn: Conley Simmonds.  Phill Myron, D.O. 11/21/2015, 12:16 PM PGY-2, [place]

## 2015-11-21 NOTE — Telephone Encounter (Signed)
Pt called about the letter for DSS. She says it has to be done by  5pm on Monday

## 2015-12-12 ENCOUNTER — Ambulatory Visit: Payer: Medicaid Other | Admitting: Internal Medicine

## 2015-12-16 ENCOUNTER — Ambulatory Visit (INDEPENDENT_AMBULATORY_CARE_PROVIDER_SITE_OTHER): Payer: Medicaid Other | Admitting: Internal Medicine

## 2015-12-16 ENCOUNTER — Encounter: Payer: Self-pay | Admitting: Internal Medicine

## 2015-12-16 VITALS — BP 180/95 | HR 78 | Temp 98.0°F | Ht 63.0 in | Wt 164.0 lb

## 2015-12-16 DIAGNOSIS — G894 Chronic pain syndrome: Secondary | ICD-10-CM | POA: Diagnosis not present

## 2015-12-16 DIAGNOSIS — I10 Essential (primary) hypertension: Secondary | ICD-10-CM

## 2015-12-16 DIAGNOSIS — G8929 Other chronic pain: Secondary | ICD-10-CM | POA: Diagnosis not present

## 2015-12-16 DIAGNOSIS — M25512 Pain in left shoulder: Secondary | ICD-10-CM | POA: Diagnosis present

## 2015-12-16 MED ORDER — ASPIRIN EC 81 MG PO TBEC: 81.0000 mg | DELAYED_RELEASE_TABLET | Freq: Every day | ORAL | 3 refills | Status: DC

## 2015-12-16 MED ORDER — AMLODIPINE BESYLATE 5 MG PO TABS: 5.0000 mg | ORAL_TABLET | Freq: Every day | ORAL | 3 refills | Status: DC

## 2015-12-16 MED ORDER — DICLOFENAC SODIUM 75 MG PO TBEC: 75.0000 mg | DELAYED_RELEASE_TABLET | Freq: Two times a day (BID) | ORAL | 1 refills | Status: DC

## 2015-12-16 MED ORDER — VENLAFAXINE HCL ER 75 MG PO CP24: 75.0000 mg | ORAL_CAPSULE | Freq: Every day | ORAL | 2 refills | Status: DC

## 2015-12-16 MED ORDER — LOSARTAN POTASSIUM-HCTZ 100-12.5 MG PO TABS: 1.0000 | ORAL_TABLET | Freq: Every day | ORAL | 3 refills | Status: DC

## 2015-12-16 MED ORDER — MELATONIN 3 MG PO TABS: 3.0000 mg | ORAL_TABLET | Freq: Every day | ORAL | 11 refills | Status: DC

## 2015-12-16 NOTE — Patient Instructions (Addendum)
You can go to [facility] for the Xray of your shoulder.   I have placed an order for physical therapy.   Try heat to the area. Do the gentle exercises we talked about (arm swings and wall walks in the shower) a few times per day. Take the Diclofenac for pain. If pain persists, a steroid injection to the shoulder may be helpful.   Please start taking your blood pressure medications as soon as possible. Let me know if you have any problems getting them.       [phone]

## 2015-12-17 NOTE — Assessment & Plan Note (Signed)
Concern for frozen shoulder given reduction of PROM and AROM. Potentially has frozen shoulder due to intention lack of mobility to that arm secondary to fibromyalgia pain.  -Xray of left shoulder ordered  -refill for Diclofenac given  -encouraged consideration of steroid injection  -gentle ROM exercises (pendulum swing, wall walks) reviewed  -PT referral placed

## 2015-12-17 NOTE — Assessment & Plan Note (Signed)
Uncontrolled today suspect secondary to lack of medication use for several months. Has previously been well controlled on her regimen. No red flags.  -refills for amlodipine and losartan-HCTZ given  -patient to return in 2 weeks for BP check

## 2015-12-17 NOTE — Progress Notes (Signed)
   Subjective:    Debbie Clarke - 55 y.o. female MRN [id number]  Date of birth: [birth year]  HPI  Debbie Clarke is here for shoulder pain.  Shoulder Pain: Of left shoulder. Present for several months. States that she has limited ROM of her shoulder I.e. Unable to get it over her head or behind her back. Describes a sensation of stiffness. Denies trauma to the area. Does report chronic fibromyalgia pain of numbness/tingling in the left upper extremity that has gotten worse and therefore she stopped using that arm as much lately. Has tried placing warm washcloth to the area in the shower and feels that it helps with stiffness.   HTN: blood pressure significantly elevated today. Patient reports that she has not been taking her BP medications for several months due to financial troubles. States that she now will have Medicare and will be be able to afford her medications starting tomorrow. Denies chest pain, vision changes, headaches, and SOB.    There are no preventive care reminders to display for this patient.  -  reports that she quit smoking about 17 years ago. Her smoking use included Cigarettes. She has a 5.00 pack-year smoking history. She has never used smokeless tobacco. - Review of Systems: Per HPI. - Past Medical History: Patient Active Problem List   Diagnosis Date Noted  . Mental health disorder 08/28/2015  . Insomnia 04/14/2015  . Left shoulder pain 03/31/2015  . Chronic diastolic congestive heart failure ([place]) 01/17/2015  . Auditory hallucinations 08/30/2013  . Diffuse pain 07/27/2013  . Hot flashes 05/26/2013  . Spinal stenosis in cervical region 08/08/2012    Class: Diagnosis of  . HYPERTENSION, BENIGN ESSENTIAL 04/27/2010   - Medications: reviewed and updated    Objective:   Physical Exam BP (!) 180/95   Pulse 78   Temp 98 F (36.7 C) (Oral)   Ht 5\' 3"  (1.6 m)   Wt 164 lb (74.4 kg)   LMP 07/06/2012   BMI 29.05 kg/m  Gen: NAD, alert, cooperative with exam,  well-appearing CV: RRR, good S1/S2, no murmur, no edema, capillary refill brisk  Resp: CTABL, no wheezes, non-labored Left Shoulder: No erythema or increased warmth to the area. A solid endpoint in PROM with abduction and external rotation was appreciated compared to right. AROM in forward flexion, abduction, external rotation, and internal rotation limited. Strength reduced compared to right.   Assessment & Plan:   Left shoulder pain Concern for frozen shoulder given reduction of PROM and AROM. Potentially has frozen shoulder due to intention lack of mobility to that arm secondary to fibromyalgia pain.  -Xray of left shoulder ordered  -refill for Diclofenac given  -encouraged consideration of steroid injection  -gentle ROM exercises (pendulum swing, wall walks) reviewed  -PT referral placed   HYPERTENSION, BENIGN ESSENTIAL Uncontrolled today suspect secondary to lack of medication use for several months. Has previously been well controlled on her regimen. No red flags.  -refills for amlodipine and losartan-HCTZ given  -patient to return in 2 weeks for BP check     Phill Myron, D.O. 12/17/2015, 3:12 PM PGY-2, [place]

## 2015-12-23 ENCOUNTER — Telehealth: Payer: Self-pay | Admitting: *Deleted

## 2015-12-23 NOTE — Telephone Encounter (Signed)
Prior Authorization received from [facility] for Diclofeanc 75 mg tab. PA is pending per  [employer]. Derl Barrow, RN

## 2015-12-23 NOTE — Telephone Encounter (Signed)
Received PA approval for Diclofenac 75 mg tablet via  [employer].  Med approved for 12/23/15 - 12/22/16.  [facility] pharmacy informed.  PA approval number [id number]. Derl Barrow, RN

## 2015-12-30 ENCOUNTER — Telehealth: Payer: Self-pay | Admitting: *Deleted

## 2015-12-30 NOTE — Telephone Encounter (Signed)
Prior Authorization received from [facility] for Losartan-HCTZ 20-12.5 mg. Received PA approval for Losartan-HCTZ 20.12.5 mg via [place] [employer].  Med approved for 12/30/15 - 12/29/16.  [facility] informed.  PA approval number [id number]. Derl Barrow, RN

## 2016-05-19 ENCOUNTER — Encounter: Payer: Medicare Other | Admitting: Internal Medicine

## 2016-05-28 ENCOUNTER — Ambulatory Visit (INDEPENDENT_AMBULATORY_CARE_PROVIDER_SITE_OTHER): Payer: Medicare Other | Admitting: Internal Medicine

## 2016-05-28 ENCOUNTER — Encounter: Payer: Self-pay | Admitting: Internal Medicine

## 2016-05-28 VITALS — BP 140/92 | HR 70 | Temp 97.8°F | Ht 63.0 in | Wt 168.6 lb

## 2016-05-28 DIAGNOSIS — Z0000 Encounter for general adult medical examination without abnormal findings: Secondary | ICD-10-CM | POA: Diagnosis not present

## 2016-05-28 DIAGNOSIS — E785 Hyperlipidemia, unspecified: Secondary | ICD-10-CM

## 2016-05-28 DIAGNOSIS — I10 Essential (primary) hypertension: Secondary | ICD-10-CM

## 2016-05-28 MED ORDER — AMLODIPINE BESYLATE 5 MG PO TABS: 5.0000 mg | ORAL_TABLET | Freq: Every day | ORAL | 3 refills | Status: DC

## 2016-05-28 MED ORDER — LOSARTAN POTASSIUM-HCTZ 100-12.5 MG PO TABS: 1.0000 | ORAL_TABLET | Freq: Every day | ORAL | 3 refills | Status: DC

## 2016-05-28 NOTE — Progress Notes (Signed)
   Subjective:    Debbie Clarke - 56 y.o. female MRN [id number]  Date of birth: [birth year]  HPI  Debbie Clarke is here for annual exam. No concerns today.   HTN: Patient reports compliance with her medications. Does not monitor her BP at home. No chest, SOB, LE edema, or headaches.    ROS:  Patient reports no  vision/ hearing changes,anorexia, weight change, fever ,adenopathy, persistant / recurrent hoarseness, swallowing issues, chest pain, edema,persistant / recurrent cough, hemoptysis, dyspnea(rest, exertional, paroxysmal nocturnal), gastrointestinal  bleeding (melena, rectal bleeding), abdominal pain, excessive heart burn, GU symptoms(dysuria, hematuria, pyuria, voiding/incontinence  Issues) syncope, focal weakness, severe memory loss, concerning skin lesions, depression, anxiety, abnormal bruising/bleeding, major joint swelling, breast masses or abnormal vaginal bleeding. Patient endorses chronic fibromyalgia pain.    Health Maintenance:  There are no preventive care reminders to display for this patient.  -  reports that she quit smoking about 17 years ago. Her smoking use included Cigarettes. She has a 5.00 pack-year smoking history. She has never used smokeless tobacco. - Review of Systems: Per HPI. - Past Medical History: Patient Active Problem List   Diagnosis Date Noted  . Mental health disorder 08/28/2015  . Insomnia 04/14/2015  . Left shoulder pain 03/31/2015  . Chronic diastolic congestive heart failure ([place]) 01/17/2015  . Auditory hallucinations 08/30/2013  . Diffuse pain 07/27/2013  . Hot flashes 05/26/2013  . Spinal stenosis in cervical region 08/08/2012    Class: Diagnosis of  . HYPERTENSION, BENIGN ESSENTIAL 04/27/2010   - Medications: reviewed and updated   Objective:   Physical Exam BP (!) 140/92   Pulse 70   Temp 97.8 F (36.6 C) (Oral)   Ht 5\' 3"  (1.6 m)   Wt 168 lb 9.6 oz (76.5 kg)   LMP 07/06/2012   SpO2 99%   BMI 29.87 kg/m  Gen: NAD,  alert, cooperative with exam, well-appearing HEENT: NCAT, PERRL, clear conjunctiva, oropharynx clear, supple neck CV: RRR, good S1/S2, no murmur, no edema, capillary refill brisk  Resp: CTABL, no wheezes, non-labored Abd: SNTND, BS present, no guarding or organomegaly Skin: no rashes, normal turgor  Neuro: no gross deficits.  Psych: good insight, alert and oriented    Assessment & Plan:   HYPERTENSION, BENIGN ESSENTIAL Essentially controlled today. Continue amlodipine and losartan-HCTZ. Will check CMET and lipid panel today.   UTD on health maintenance items.   Phill Myron, D.O. 05/31/2016, 11:31 AM PGY-2, [place]

## 2016-05-28 NOTE — Patient Instructions (Addendum)
So great to see you today! Congrats to your daughter and soon to be son in law :)   I will call you with your lab results. Please see me in 6 months for follow up or sooner if you have any problems. I have sent your medications to your pharmacy let me know if there are issues.   Dr. Juleen China

## 2016-05-29 LAB — COMPREHENSIVE METABOLIC PANEL
A/G RATIO: 1.5 (ref 1.2–2.2)
ALK PHOS: 73 IU/L (ref 39–117)
ALT: 8 IU/L (ref 0–32)
AST: 9 IU/L (ref 0–40)
Albumin: 4 g/dL (ref 3.5–5.5)
BUN/Creatinine Ratio: 16 (ref 9–23)
BUN: 10 mg/dL (ref 6–24)
Bilirubin Total: <0.2 mg/dL (ref 0.0–1.2)
CALCIUM: 8.7 mg/dL (ref 8.7–10.2)
CHLORIDE: 102 mmol/L (ref 96–106)
CO2: 25 mmol/L (ref 18–29)
Creatinine, Ser: 0.62 mg/dL (ref 0.57–1.00)
GFR calc Af Amer: 117 mL/min/{1.73_m2} (ref >59)
GFR calc non Af Amer: 102 mL/min/{1.73_m2} (ref >59)
GLOBULIN, TOTAL: 2.7 g/dL (ref 1.5–4.5)
Glucose: 89 mg/dL (ref 65–99)
POTASSIUM: 3.8 mmol/L (ref 3.5–5.2)
SODIUM: 141 mmol/L (ref 134–144)
Total Protein: 6.7 g/dL (ref 6.0–8.5)

## 2016-05-29 LAB — LIPID PANEL
CHOLESTEROL TOTAL: 150 mg/dL (ref 100–199)
Chol/HDL Ratio: 3.7 ratio (ref 0.0–4.4)
HDL: 41 mg/dL (ref >39)
LDL Calculated: 86 mg/dL (ref 0–99)
TRIGLYCERIDES: 115 mg/dL (ref 0–149)
VLDL Cholesterol Cal: 23 mg/dL (ref 5–40)

## 2016-05-31 ENCOUNTER — Telehealth: Payer: Self-pay | Admitting: Internal Medicine

## 2016-05-31 NOTE — Telephone Encounter (Signed)
ASCVD 10 year risk calculated to be 6.8%. If added moderate intensity statin risk would improve to 5.0%. Called to discuss these results with patient. Patient agreeable to starting Lipitor 20 mg daily. Counseled on potential side effects.   Phill Myron, D.O. 06/04/2016, 1:49 PM PGY-2, [place]

## 2016-05-31 NOTE — Assessment & Plan Note (Addendum)
Essentially controlled today. Continue amlodipine and losartan-HCTZ. Will check CMET and lipid panel today.

## 2016-06-04 MED ORDER — ATORVASTATIN CALCIUM 20 MG PO TABS: 20.0000 mg | ORAL_TABLET | Freq: Every day | ORAL | 0 refills | Status: DC

## 2016-06-25 ENCOUNTER — Other Ambulatory Visit: Payer: Self-pay | Admitting: Internal Medicine

## 2016-07-02 ENCOUNTER — Other Ambulatory Visit: Payer: Self-pay | Admitting: Internal Medicine

## 2016-07-02 DIAGNOSIS — G894 Chronic pain syndrome: Secondary | ICD-10-CM

## 2016-07-29 ENCOUNTER — Other Ambulatory Visit: Payer: Self-pay | Admitting: Internal Medicine

## 2016-08-10 ENCOUNTER — Emergency Department (HOSPITAL_COMMUNITY)
Admission: EM | Admit: 2016-08-10 | Discharge: 2016-08-10 | Disposition: A | Discharge status: Home / Self Care | Payer: Medicare Other | Attending: Emergency Medicine | Admitting: Emergency Medicine

## 2016-08-10 ENCOUNTER — Encounter (HOSPITAL_COMMUNITY): Payer: Self-pay | Admitting: Emergency Medicine

## 2016-08-10 DIAGNOSIS — Z87891 Personal history of nicotine dependence: Secondary | ICD-10-CM | POA: Diagnosis not present

## 2016-08-10 DIAGNOSIS — R21 Rash and other nonspecific skin eruption: Secondary | ICD-10-CM | POA: Diagnosis present

## 2016-08-10 DIAGNOSIS — I10 Essential (primary) hypertension: Secondary | ICD-10-CM | POA: Insufficient documentation

## 2016-08-10 DIAGNOSIS — H579 Unspecified disorder of eye and adnexa: Secondary | ICD-10-CM | POA: Diagnosis not present

## 2016-08-10 DIAGNOSIS — Z79899 Other long term (current) drug therapy: Secondary | ICD-10-CM | POA: Diagnosis not present

## 2016-08-10 DIAGNOSIS — Z7982 Long term (current) use of aspirin: Secondary | ICD-10-CM | POA: Insufficient documentation

## 2016-08-10 DIAGNOSIS — R51 Headache: Secondary | ICD-10-CM | POA: Diagnosis not present

## 2016-08-10 DIAGNOSIS — H04201 Unspecified epiphora, right lacrimal gland: Secondary | ICD-10-CM | POA: Insufficient documentation

## 2016-08-10 DIAGNOSIS — I5032 Chronic diastolic (congestive) heart failure: Secondary | ICD-10-CM | POA: Diagnosis not present

## 2016-08-10 MED ORDER — FLUORESCEIN SODIUM 0.6 MG OP STRP
1.0000 | ORAL_STRIP | Freq: Once | OPHTHALMIC | Status: AC
  Administered 2016-08-10: 1 via OPHTHALMIC

## 2016-08-10 MED ORDER — OLOPATADINE HCL 0.2 % OP SOLN: 1.0000 [drp] | Freq: Every day | OPHTHALMIC | 0 refills | Status: DC

## 2016-08-10 MED ORDER — NYSTATIN 100000 UNIT/GM EX POWD: Freq: Three times a day (TID) | CUTANEOUS | 0 refills | Status: DC

## 2016-08-10 MED ORDER — TETRACAINE HCL 0.5 % OP SOLN
2.0000 [drp] | Freq: Once | OPHTHALMIC | Status: AC
  Administered 2016-08-10: 2 [drp] via OPHTHALMIC
  Filled 2016-08-10: qty 4

## 2016-08-10 NOTE — ED Provider Notes (Signed)
[facility] DEPT Provider Note   CSN: [id number] Arrival date & time: 08/10/16  5956   By signing my name below, I, [name], attest that this documentation has been prepared under the direction and in the presence of [facility], PA-C. Electronically Signed: [name], Scribe. 08/10/2016. 11:18 AM.   History   Chief Complaint Chief Complaint  Patient presents with  . Rash  . Eye Pain    The history is provided by the patient. No language interpreter was used.     HPI Comments:  Debbie Clarke is a 56 y.o. female who presents to the Emergency Department with two complaints:  1. Pruritic rash to her gluteal cleft x ~ 1 week . The rash seems to be worse after she showers when she scratches the site.  She notes she has been wearing waistband for weight loss and notes sweats run down the area. Worse with any type of heat. She believes she was cleaning it appropriately. She has applied Nystatin and medicated vaseline without relief. She ran out of the Nystatin a few days ago and has only been able to use the vaseline. When using strictly Vaseline, the area became worse.  2. She is also complaining of right eye watering. She has applied warm compress without relief. She notes associated matting of the eye and mild soreness. She also reports redness to the eye that resolved yesterday. No FB sensation at this time but gritty sensation in the eye a few days ago. Pt wears corrective lenses. No use of contacts. No acute vision changes. She denies h/o seasonal allergies. No problems with the eye today as symptoms seem to have resolved.  Past Medical History:  Diagnosis Date  . Arthritis   . Blood dyscrasia    sickle cell trait  . Fibromyalgia   . GERD (gastroesophageal reflux disease)   . Hx of sickle cell trait   . Hypertension     Patient Active Problem List   Diagnosis Date Noted  . Mental health disorder 08/28/2015  . Insomnia 04/14/2015  . Chronic diastolic congestive  heart failure ([place]) 01/17/2015  . Auditory hallucinations 08/30/2013  . Diffuse pain 07/27/2013  . Spinal stenosis in cervical region 08/08/2012    Class: Diagnosis of  . HYPERTENSION, BENIGN ESSENTIAL 04/27/2010    Past Surgical History:  Procedure Laterality Date  . ANTERIOR CERVICAL DECOMP/DISCECTOMY FUSION N/A 08/07/2012   Procedure: ANTERIOR CERVICAL DECOMPRESSION/DISCECTOMY FUSION 2 LEVELS;  Surgeon: Marybelle Killings, MD;  Location: [facility];  Service: Orthopedics;  Laterality: N/A;  C5-6, C6-7 ACD&F, ALLOGRAFT AND PLATES  . [place]    OB History    No data available       Home Medications    Prior to Admission medications   Medication Sig Start Date End Date Taking? Authorizing Provider  amLODipine (NORVASC) 5 MG tablet Take 1 tablet (5 mg total) by mouth daily. 05/28/16   Nicolette Bang, DO  asenapine (SAPHRIS) 5 MG SUBL 24 hr tablet Place 5 mg under the tongue 2 (two) times daily.    [provider]  aspirin EC 81 MG tablet Take 1 tablet (81 mg total) by mouth daily. 12/16/15   Nicolette Bang, DO  atorvastatin (LIPITOR) 20 MG tablet Take 1 tablet (20 mg total) by mouth daily. 06/04/16   Nicolette Bang, DO  diclofenac (VOLTAREN) 75 MG EC tablet TAKE 1 TABLET TWICE A DAY 06/29/16   Nicolette Bang, DO  losartan-hydrochlorothiazide [facility])  100-12.5 MG tablet Take 1 tablet by mouth daily. 05/28/16   Nicolette Bang, DO  Melatonin 3 MG TABS Take 1 tablet (3 mg total) by mouth at bedtime. OTC please help patient make a selection 12/16/15   Nicolette Bang, DO  nystatin (MYCOSTATIN/NYSTOP) powder Apply topically 3 (three) times daily. 08/10/16   Ward, Ozella Almond, PA-C  Olopatadine HCl 0.2 % SOLN Apply 1 drop to eye daily. 08/10/16   Ward, Ozella Almond, PA-C  ranitidine (ZANTAC) 150 MG tablet TAKE 1 TABLET BY MOUTH AT BEDTIME 07/29/16   Nicolette Bang, DO  venlafaxine XR (EFFEXOR-XR) 75  MG 24 hr capsule TAKE 1 CAPSULE (75 MG TOTAL) BY MOUTH DAILY WITH BREAKFAST. 07/02/16   Nicolette Bang, DO    Family History Family History  Problem Relation Age of Onset  . Hypertension Father   . Cancer Father     Social History Social History  Substance Use Topics  . Smoking status: Former Smoker    Packs/day: 0.25    Years: 20.00    Types: Cigarettes    Quit date: 08/02/1998  . Smokeless tobacco: Never Used  . Alcohol use No     Allergies   Patient has no known allergies.   Review of Systems Review of Systems  Constitutional: Negative for chills and fever.  Eyes: Positive for pain, discharge and redness (resolved). Negative for visual disturbance.  Respiratory: Negative for shortness of breath.   Cardiovascular: Negative for chest pain.  Skin: Positive for rash.     Physical Exam Updated Vital Signs BP 131/76   Pulse 64   Temp 98.2 F (36.8 C)   Resp 17   LMP 07/06/2012   SpO2 99%   Physical Exam  Constitutional: She appears well-developed and well-nourished. No distress.  HENT:  Head: Normocephalic and atraumatic.  Eyes: Conjunctivae, EOM and lids are normal. Pupils are equal, round, and reactive to light. Lids are everted and swept, no foreign bodies found. Right eye exhibits no discharge. No foreign body present in the right eye. Left eye exhibits no discharge. No foreign body present in the left eye.  Slit lamp exam:      The right eye shows no corneal abrasion and no fluorescein uptake.  Neck: Neck supple.  Cardiovascular: Normal rate, regular rhythm and normal heart sounds.   No murmur heard. Pulmonary/Chest: Effort normal and breath sounds normal. No respiratory distress. She has no wheezes. She has no rales.  Musculoskeletal: Normal range of motion.  Neurological: She is alert.  Skin: Skin is warm and dry.  5x9 erythematous rash with red surrounding satellite lesions to gluteal cleft with lesions bilaterally. No purulent drainage.  Excoriations present.  Nursing note and vitals reviewed.    ED Treatments / Results  DIAGNOSTIC STUDIES:  Oxygen Saturation is 99% on RA, normal by my interpretation.    COORDINATION OF CARE:  11:18 AM Discussed treatment plan with pt at bedside and pt agreed to plan.  Labs (all labs ordered are listed, but only abnormal results are displayed) Labs Reviewed - No data to display  EKG  EKG Interpretation None       Radiology No results found.  Procedures Procedures (including critical care time)  Medications Ordered in ED Medications  tetracaine (PONTOCAINE) 0.5 % ophthalmic solution 2 drop (not administered)  fluorescein ophthalmic strip 1 strip (not administered)     Initial Impression / Assessment and Plan / ED Course  I have reviewed the triage vital signs and the  nursing notes.  Pertinent labs & imaging results that were available during my care of the patient were reviewed by me and considered in my medical decision making (see chart for details).    Shaylin SHAMIRACLE GORDEN is a 56 y.o. female who presents to ED for two complaints:  1. Rash c/w fungal likely 2/2 heat and sweat from wearing weight loss belt to the area. Rx for nystatin powder. Home care instructions including keeping area clean and dry discussed. PCP follow up if no improvement.   2. Right eye watering and clear discharge. No erythema. Normal exam and no fluoroscein uptake or signs of abrasion. No visual changes. Likely 2/2 allergies. Rx for olopatadine provided. PCP or eye doctor follow up if no improvement.   Reasons to return to ER discussed. All questions answered.   Final Clinical Impressions(s) / ED Diagnoses   Final diagnoses:  Rash  Watering of right eye    New Prescriptions New Prescriptions   NYSTATIN (MYCOSTATIN/NYSTOP) POWDER    Apply topically 3 (three) times daily.   OLOPATADINE HCL 0.2 % SOLN    Apply 1 drop to eye daily.   I personally performed the services described in  this documentation, which was scribed in my presence. The recorded information has been reviewed and is accurate.     Ward, Ozella Almond, PA-C 08/10/16 [place], [place], DO 08/13/16 [phone]

## 2016-08-10 NOTE — ED Triage Notes (Signed)
Pt c/o rower back rash onset 7 days ago. Multiple small open nodules < 1 cm diameter to midline distal gluteal cleft. Lesions cross midline. No rash around rectum. Had been wearing hot waistband for weight loss.  Also c/o right eye pain and tearing at bilateral corners of eyes onset 3-4 days ago.

## 2016-08-10 NOTE — Discharge Instructions (Signed)
It was my pleasure taking care of you today!   Please keep rash clean and DRY. Apply nystatin powder as directed. If rash does not improve in 1 week, please see your primary care provider for further evaluation. If rash worsens, please follow up sooner or return to ER for recheck.   Eye drops to affected eye until symptoms resolve. If no improvement, follow up with your primary care provider.   Return to ER for new or worsening symptoms, any additional concerns.

## 2016-10-20 ENCOUNTER — Ambulatory Visit (INDEPENDENT_AMBULATORY_CARE_PROVIDER_SITE_OTHER): Payer: Medicare Other | Admitting: Internal Medicine

## 2016-10-20 ENCOUNTER — Encounter: Payer: Self-pay | Admitting: Internal Medicine

## 2016-10-20 VITALS — BP 160/70 | HR 74 | Temp 98.9°F | Ht 63.0 in | Wt 157.6 lb

## 2016-10-20 DIAGNOSIS — J069 Acute upper respiratory infection, unspecified: Secondary | ICD-10-CM

## 2016-10-20 DIAGNOSIS — B9789 Other viral agents as the cause of diseases classified elsewhere: Secondary | ICD-10-CM | POA: Diagnosis not present

## 2016-10-20 MED ORDER — FLUTICASONE PROPIONATE 50 MCG/ACT NA SUSP: 2.0000 | Freq: Every day | NASAL | 1 refills | Status: DC

## 2016-10-20 MED ORDER — OXYMETAZOLINE HCL 0.05 % NA SOLN: 1.0000 | Freq: Two times a day (BID) | NASAL | 0 refills | Status: DC

## 2016-10-20 MED ORDER — BENZONATATE 100 MG PO CAPS: 100.0000 mg | ORAL_CAPSULE | Freq: Two times a day (BID) | ORAL | 0 refills | Status: DC | PRN

## 2016-10-20 NOTE — Patient Instructions (Signed)
It appears that you have a nasty cold. Antibiotics would not be helpful in this case. I have prescribed Flonase, a nasal spray, that you can use daily that helps decrease congestion over time. I have also prescribed Afrin. Afrin can be used for 2-3 days to help rapidly decrease nasal congestion. Don't use it longer than that as it can cause worsening of congestion.   I have also prescribed Tessalon for your cough. Continue with the Mucinex. Drink plenty of fluids and avoid being outside in the humidity.   If you have fevers, worsening of your cough over the next couple of weeks, or significant SOB please return.   Always good to see you! Feel better!   Dr. Juleen China

## 2016-10-21 NOTE — Progress Notes (Signed)
   Subjective:    Debbie Clarke - 56 y.o. female MRN [id number]  Date of birth: [birth year]  HPI  Marivel D Helton is here for cough/congestion. Cough started about 24-48 hours ago. Cough is non-productive.  Was preceded by a day of nasal congestion and facial pressure. Afebrile at home. No nausea, vomiting, abdominal pain, ear pain or chest pain. Has been taking Mucinex without much relief.     -  reports that she quit smoking about 18 years ago. Her smoking use included Cigarettes. She has a 5.00 pack-year smoking history. She has never used smokeless tobacco. - Review of Systems: Per HPI. - Past Medical History: Patient Active Problem List   Diagnosis Date Noted  . Mental health disorder 08/28/2015  . Insomnia 04/14/2015  . Chronic diastolic congestive heart failure ([place]) 01/17/2015  . Auditory hallucinations 08/30/2013  . Diffuse pain 07/27/2013  . Spinal stenosis in cervical region 08/08/2012    Class: Diagnosis of  . HYPERTENSION, BENIGN ESSENTIAL 04/27/2010   - Medications: reviewed and updated   Objective:   Physical Exam BP (!) 160/70 (BP Location: Left Arm, Patient Position: Sitting, Cuff Size: Normal)   Pulse 74   Temp 98.9 F (37.2 C) (Oral)   Ht 5\' 3"  (1.6 m)   Wt 157 lb 9.6 oz (71.5 kg)   LMP 07/06/2012   SpO2 98%   BMI 27.92 kg/m  Gen: NAD, alert, cooperative with exam, well-appearing HEENT: NCAT, PERRL, clear conjunctiva, oropharynx erythematous without tonsillar exudates or hypertrophy, nasal turbinates swollen L >R CV: RRR, good S1/S2, no murmur, no edema, capillary refill brisk  Resp: CTABL, no wheezes, non-labored    Assessment & Plan:   1. Viral URI with cough Suspect viral URI with postnasal drip as cause of symptoms. Doubt CAP as lungs clear, patient afebrile, and not hypoxic. Will treat with nasal sprays and anti-tussives. Recommended continued use of Mucinex. Return precautions discussed.  - benzonatate (TESSALON) 100 MG capsule; Take 1 capsule  (100 mg total) by mouth 2 (two) times daily as needed for cough.  Dispense: 30 capsule; Refill: 0 - fluticasone (FLONASE) 50 MCG/ACT nasal spray; Place 2 sprays into both nostrils daily.  Dispense: 16 g; Refill: 1 - oxymetazoline (AFRIN) 0.05 % nasal spray; Place 1 spray into both nostrils 2 (two) times daily.  Dispense: 30 mL; Refill: 0   Phill Myron, D.O. 10/21/2016, 1:30 PM PGY-3, [place]

## 2017-02-22 DIAGNOSIS — K921 Melena: Secondary | ICD-10-CM | POA: Diagnosis not present

## 2017-02-23 ENCOUNTER — Encounter (HOSPITAL_COMMUNITY): Payer: Self-pay | Admitting: *Deleted

## 2017-02-23 ENCOUNTER — Other Ambulatory Visit: Payer: Self-pay

## 2017-02-23 ENCOUNTER — Emergency Department (HOSPITAL_COMMUNITY)
Admission: EM | Admit: 2017-02-23 | Discharge: 2017-02-23 | Disposition: A | Discharge status: Home / Self Care | Payer: Medicare Other | Attending: Emergency Medicine | Admitting: Emergency Medicine

## 2017-02-23 DIAGNOSIS — I10 Essential (primary) hypertension: Secondary | ICD-10-CM | POA: Insufficient documentation

## 2017-02-23 DIAGNOSIS — Z87891 Personal history of nicotine dependence: Secondary | ICD-10-CM | POA: Insufficient documentation

## 2017-02-23 DIAGNOSIS — X58XXXA Exposure to other specified factors, initial encounter: Secondary | ICD-10-CM | POA: Insufficient documentation

## 2017-02-23 DIAGNOSIS — Y999 Unspecified external cause status: Secondary | ICD-10-CM | POA: Insufficient documentation

## 2017-02-23 DIAGNOSIS — T22011A Burn of unspecified degree of right forearm, initial encounter: Secondary | ICD-10-CM | POA: Diagnosis not present

## 2017-02-23 DIAGNOSIS — Z79899 Other long term (current) drug therapy: Secondary | ICD-10-CM | POA: Insufficient documentation

## 2017-02-23 DIAGNOSIS — Y929 Unspecified place or not applicable: Secondary | ICD-10-CM | POA: Insufficient documentation

## 2017-02-23 DIAGNOSIS — S59911A Unspecified injury of right forearm, initial encounter: Secondary | ICD-10-CM | POA: Diagnosis present

## 2017-02-23 DIAGNOSIS — Y93G3 Activity, cooking and baking: Secondary | ICD-10-CM | POA: Insufficient documentation

## 2017-02-23 DIAGNOSIS — Z7982 Long term (current) use of aspirin: Secondary | ICD-10-CM | POA: Insufficient documentation

## 2017-02-23 DIAGNOSIS — T22211A Burn of second degree of right forearm, initial encounter: Secondary | ICD-10-CM

## 2017-02-23 DIAGNOSIS — T22111A Burn of first degree of right forearm, initial encounter: Secondary | ICD-10-CM | POA: Diagnosis not present

## 2017-02-23 MED ORDER — SILVER SULFADIAZINE 1 % EX CREA: 1.0000 "application " | TOPICAL_CREAM | Freq: Every day | CUTANEOUS | 0 refills | Status: DC
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
## 2017-03-22 ENCOUNTER — Other Ambulatory Visit: Payer: Self-pay

## 2017-03-22 ENCOUNTER — Encounter: Payer: Self-pay | Admitting: Internal Medicine

## 2017-03-22 ENCOUNTER — Ambulatory Visit (INDEPENDENT_AMBULATORY_CARE_PROVIDER_SITE_OTHER): Payer: Medicare Other | Admitting: Internal Medicine

## 2017-03-22 ENCOUNTER — Other Ambulatory Visit: Payer: Self-pay | Admitting: Internal Medicine

## 2017-03-22 ENCOUNTER — Other Ambulatory Visit (HOSPITAL_COMMUNITY)
Admission: RE | Admit: 2017-03-22 | Discharge: 2017-03-22 | Disposition: A | Discharge status: Home / Self Care | Payer: Medicare Other | Source: Ambulatory Visit | Attending: Family Medicine | Admitting: Family Medicine

## 2017-03-22 VITALS — BP 132/78 | HR 64 | Temp 98.2°F | Ht 63.0 in | Wt 155.8 lb

## 2017-03-22 DIAGNOSIS — R05 Cough: Secondary | ICD-10-CM | POA: Diagnosis not present

## 2017-03-22 DIAGNOSIS — Z124 Encounter for screening for malignant neoplasm of cervix: Secondary | ICD-10-CM | POA: Insufficient documentation

## 2017-03-22 DIAGNOSIS — J069 Acute upper respiratory infection, unspecified: Secondary | ICD-10-CM | POA: Insufficient documentation

## 2017-03-22 DIAGNOSIS — B9789 Other viral agents as the cause of diseases classified elsewhere: Secondary | ICD-10-CM | POA: Diagnosis not present

## 2017-03-22 MED ORDER — BENZONATATE 100 MG PO CAPS: 100.0000 mg | ORAL_CAPSULE | Freq: Three times a day (TID) | ORAL | 0 refills | Status: DC | PRN

## 2017-03-22 NOTE — Patient Instructions (Addendum)
We performed your PAP today screening for cervical cancer. We will call you with the results in the next few days.   I think you have a viral respiratory infection. I have prescribed Tessalon to help with your cough. Use your flonase daily and use the Afrin for 3 days. Do not use the Afrin for longer than 3 days as it can worsen your congestion. If you have fevers again, worsening of your cough or shortness of breath you need to be seen again as these could be signs of a bacterial infection.

## 2017-03-22 NOTE — Progress Notes (Signed)
   Subjective:    Debbie Clarke - 57 y.o. female MRN [id number]  Date of birth: 18-Sep-1960  HPI  Debbie Clarke is here for cervical cancer screening. Last PAP 8/15 negative for lesions and HPV. No known history of abnormal PAPs. No abnormal vaginal discharge or pelvic pain. No vaginal bleeding. Declines STD testing.   Additionally, patient endorses congestion and cough for about 6-7 days. Initially had subjective fevers but those improved after 1-2 days. Symptoms have not acutely worsened. She has a dry cough and sensation of needing to clear her throat frequently. Has been using Alka Seltzer cold medicine without relief. No SOB or chest pain.    Health Maintenance Due  Topic Date Due  . INFLUENZA VACCINE  09/15/2016  . PAP SMEAR  09/19/2016    -  reports that she quit smoking about 18 years ago. Her smoking use included cigarettes. She has a 5.00 pack-year smoking history. she has never used smokeless tobacco. - Review of Systems: Per HPI. - Past Medical History: Patient Active Problem List   Diagnosis Date Noted  . Mental health disorder 08/28/2015  . Insomnia 04/14/2015  . Chronic diastolic congestive heart failure ([place]) 01/17/2015  . Auditory hallucinations 08/30/2013  . Diffuse pain 07/27/2013  . Spinal stenosis in cervical region 08/08/2012    Class: Diagnosis of  . HYPERTENSION, BENIGN ESSENTIAL 04/27/2010   - Medications: reviewed and updated   Objective:   Physical Exam BP 132/78   Pulse 64   Temp 98.2 F (36.8 C) (Oral)   Ht 5\' 3"  (1.6 m)   Wt 155 lb 12.8 oz (70.7 kg)   LMP 07/06/2012   SpO2 99%   BMI 27.60 kg/m  Gen: NAD, alert, cooperative with exam, well-appearing HEENT: NCAT, PERRL, clear conjunctiva, oropharynx clear, nasal turbinates markedly edematous bilaterally, TMs normal bilaterally  CV: RRR, good S1/S2, no murmur, no edema, capillary refill brisk  Resp: CTABL, no wheezes, non-labored GU/GYN: Exam performed in the presence of a chaperone.  External genitalia within normal limits.  Vaginal mucosa pink, moist, normal rugae.  Nonfriable cervix without lesions, no discharge or bleeding noted on speculum exam.  Bimanual exam revealed normal, nongravid uterus.  No cervical motion tenderness. No adnexal masses bilaterally.       Assessment & Plan:   1. Viral URI with cough Symptoms appear consistent with viral URI with cough caused by postnasal drip. Doubt CAP given lack of lung exam findings, no hypoxia, and patient afebrile. Recommended Tessalon for cough. Resume Flonase and take Afrin x3 days. Return precautions discussed.  - benzonatate (TESSALON) 100 MG capsule; Take 1 capsule (100 mg total) by mouth 3 (three) times daily as needed for cough.  Dispense: 20 capsule; Refill: 0  2. Screening for malignant neoplasm of cervix - Cytology - PAP([place])  Debbie Clarke, D.O. 03/23/2017, 9:04 AM PGY-3, [place]

## 2017-03-23 DIAGNOSIS — K640 First degree hemorrhoids: Secondary | ICD-10-CM | POA: Diagnosis not present

## 2017-03-23 DIAGNOSIS — R195 Other fecal abnormalities: Secondary | ICD-10-CM | POA: Diagnosis not present

## 2017-03-23 DIAGNOSIS — D126 Benign neoplasm of colon, unspecified: Secondary | ICD-10-CM | POA: Diagnosis not present

## 2017-03-24 LAB — CYTOLOGY - PAP
Diagnosis: NEGATIVE
HPV: NOT DETECTED

## 2017-03-28 ENCOUNTER — Telehealth: Payer: Self-pay

## 2017-03-28 NOTE — Telephone Encounter (Signed)
-----   Message from Nicolette Bang, DO sent at 03/27/2017  8:47 AM EST ----- Please call patient to let her know PAP was normal.   Phill Myron, D.O. 03/27/2017, 8:47 AM PGY-3, [place]

## 2017-03-28 NOTE — Telephone Encounter (Signed)
PC, phone rang with no answer and no VM. Ottis Stain, CMA

## 2017-03-28 NOTE — Telephone Encounter (Signed)
-----   Message from Nicolette Bang, DO sent at 03/27/2017  8:47 AM EST ----- Please call patient to let her know PAP was normal.   Phill Myron, D.O. 03/27/2017, 8:47 AM PGY-3, [place]

## 2017-03-29 DIAGNOSIS — D126 Benign neoplasm of colon, unspecified: Secondary | ICD-10-CM | POA: Diagnosis not present

## 2017-03-29 NOTE — Telephone Encounter (Signed)
Attempted to contact pt but phone only rang and then stopped ringing with no option to leave voice mail.  If pt calls back please inform her of below. Katharina Caper, April D, [place]

## 2017-04-05 IMAGING — CR DG CHEST 2V
2 series · 2 of 2 positions shown · non-contrast
Comparison: Chest x-ray 04/27/2012.

CLINICAL DATA: 54-year-old female with cough and phlegm production.
Shortness of breath. Mid chest pain that radiates beneath the left
breast for the past 2 weeks.

EXAM:
CHEST  2 VIEW

[w chest pa]
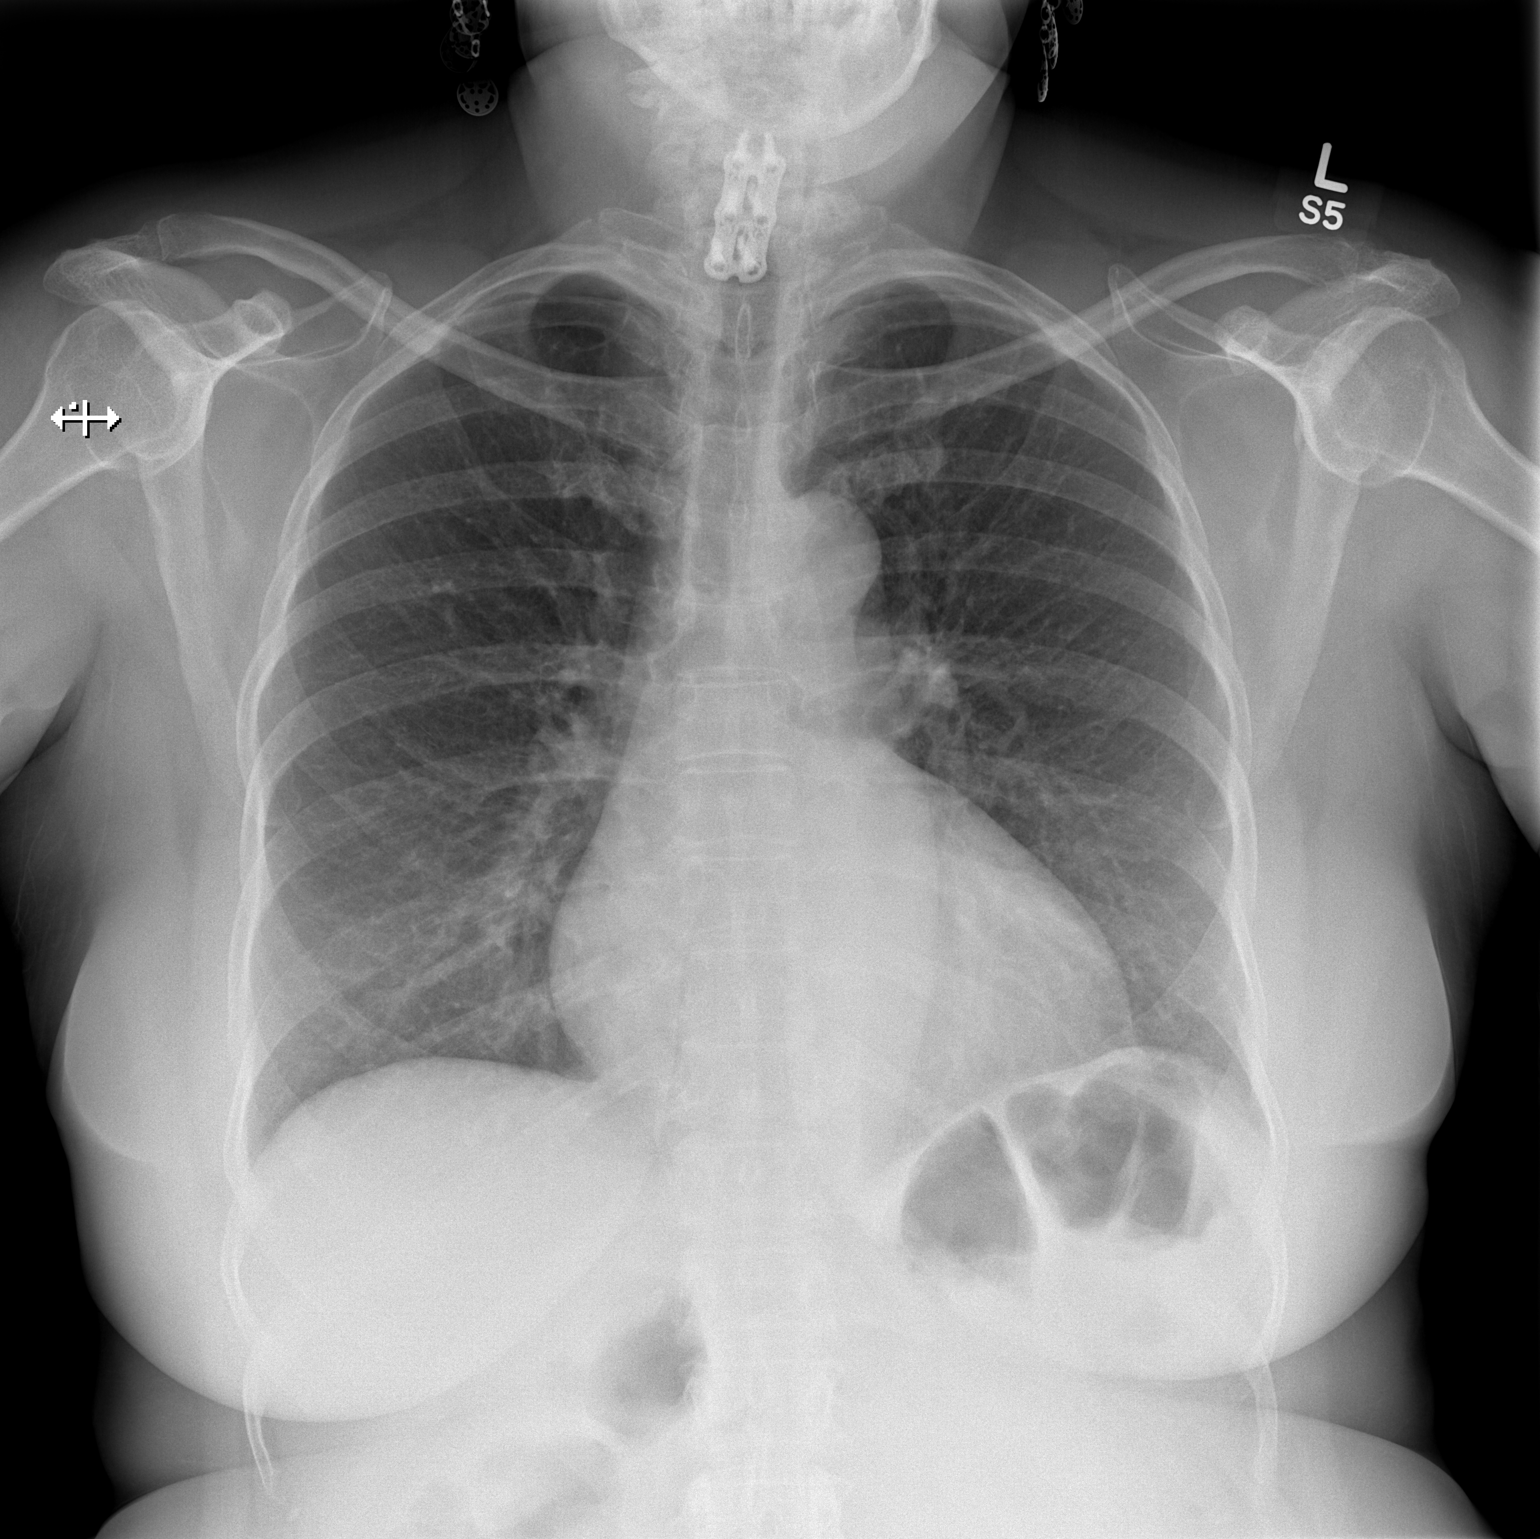

[w chest lat]
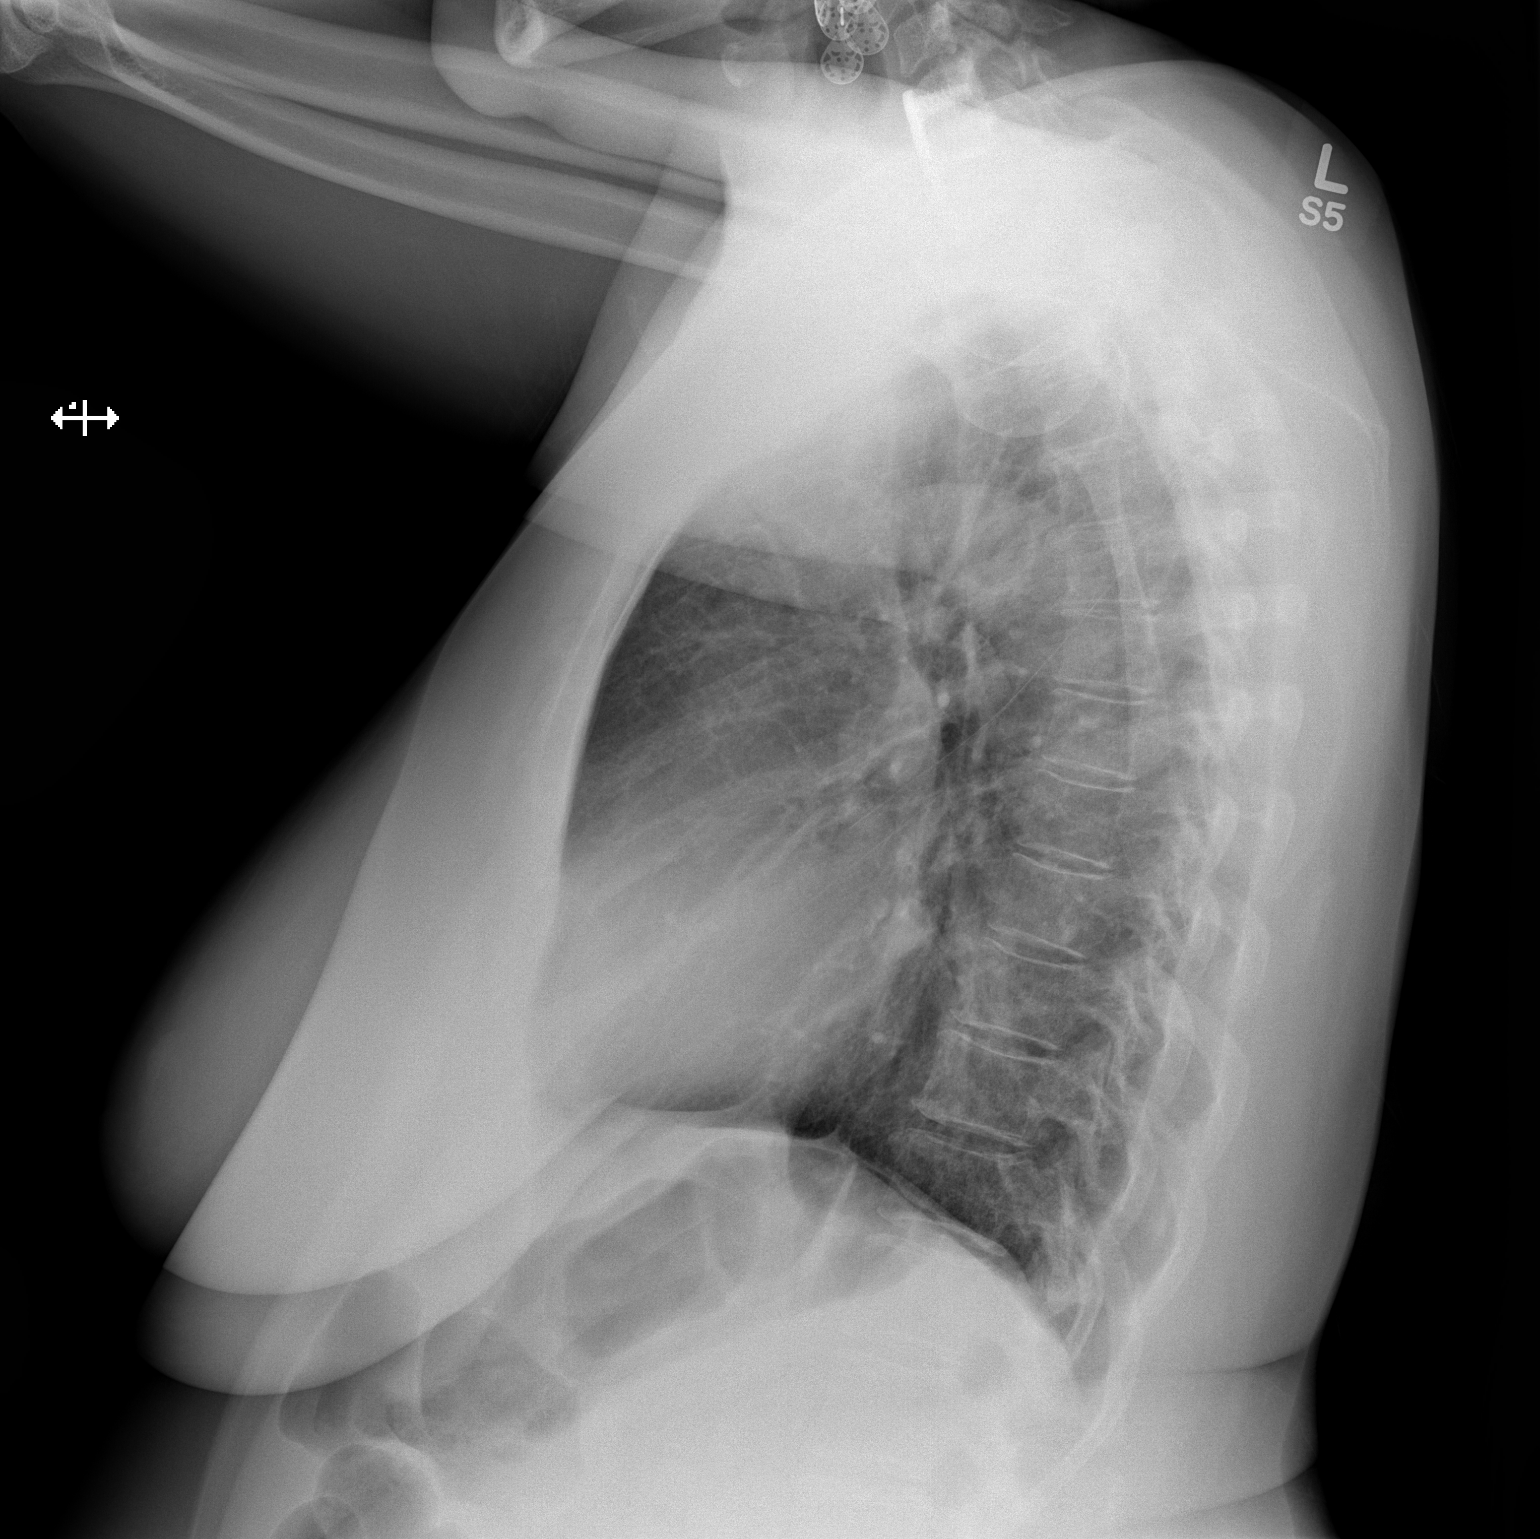

[2 of 2 positions shown; findings below may reference images not displayed]

FINDINGS: Lung volumes are normal. No consolidative airspace disease. No
pleural effusions. No evidence of pulmonary edema. Heart size is
mildly enlarged. Upper mediastinal contours are within normal
limits. Orthopedic fixation hardware in the lower cervical spine
incidentally noted.
IMPRESSION: 1. No radiographic evidence of acute cardiopulmonary disease.
2. Mild cardiomegaly. This appears to be new compared to prior study
04/27/2012.

## 2017-09-02 ENCOUNTER — Emergency Department (HOSPITAL_COMMUNITY): Payer: Medicare Other

## 2017-09-02 ENCOUNTER — Emergency Department (HOSPITAL_COMMUNITY)
Admission: EM | Admit: 2017-09-02 | Discharge: 2017-09-02 | Disposition: A | Discharge status: Home / Self Care | Payer: Medicare Other | Attending: Emergency Medicine | Admitting: Emergency Medicine

## 2017-09-02 ENCOUNTER — Other Ambulatory Visit: Payer: Self-pay

## 2017-09-02 ENCOUNTER — Encounter (HOSPITAL_COMMUNITY): Payer: Self-pay

## 2017-09-02 DIAGNOSIS — Y939 Activity, unspecified: Secondary | ICD-10-CM | POA: Insufficient documentation

## 2017-09-02 DIAGNOSIS — Y929 Unspecified place or not applicable: Secondary | ICD-10-CM | POA: Diagnosis not present

## 2017-09-02 DIAGNOSIS — M25461 Effusion, right knee: Secondary | ICD-10-CM | POA: Diagnosis not present

## 2017-09-02 DIAGNOSIS — Y999 Unspecified external cause status: Secondary | ICD-10-CM | POA: Insufficient documentation

## 2017-09-02 DIAGNOSIS — S8991XA Unspecified injury of right lower leg, initial encounter: Secondary | ICD-10-CM | POA: Diagnosis present

## 2017-09-02 DIAGNOSIS — R600 Localized edema: Secondary | ICD-10-CM | POA: Diagnosis not present

## 2017-09-02 DIAGNOSIS — M25561 Pain in right knee: Secondary | ICD-10-CM | POA: Diagnosis not present

## 2017-09-02 DIAGNOSIS — W1830XA Fall on same level, unspecified, initial encounter: Secondary | ICD-10-CM | POA: Insufficient documentation

## 2017-09-02 NOTE — ED Triage Notes (Signed)
patient reports that she lost her balance and fell 2 days ago. Patient c/o right knee pain and swelling.

## 2017-09-02 NOTE — ED Provider Notes (Signed)
[facility] DEPT Provider Note   CSN: [id number] Arrival date & time: 09/02/17  1253     History   Chief Complaint Chief Complaint  Patient presents with  . Knee Injury  . Fall    HPI Debbie Clarke is a 57 y.o. female.  HPI Patient is a 57 year old female presents the emergency department with ongoing right knee pain after falling forward onto her right knee 2 days ago.  She has a small abrasion over the patella which she is been treating with topical antibiotics.  She reports ongoing pain with ambulation and pain with range of motion of her right knee.  No other complaints.  Pain is moderate in severity   Past Medical History:  Diagnosis Date  . Arthritis   . Blood dyscrasia    sickle cell trait  . Fibromyalgia   . GERD (gastroesophageal reflux disease)   . Hx of sickle cell trait   . Hypertension     Patient Active Problem List   Diagnosis Date Noted  . Mental health disorder 08/28/2015  . Insomnia 04/14/2015  . Chronic diastolic congestive heart failure ([place]) 01/17/2015  . Auditory hallucinations 08/30/2013  . Diffuse pain 07/27/2013  . Spinal stenosis in cervical region 08/08/2012    Class: Diagnosis of  . HYPERTENSION, BENIGN ESSENTIAL 04/27/2010    Past Surgical History:  Procedure Laterality Date  . ANTERIOR CERVICAL DECOMP/DISCECTOMY FUSION N/A 08/07/2012   Procedure: ANTERIOR CERVICAL DECOMPRESSION/DISCECTOMY FUSION 2 LEVELS;  Surgeon: Marybelle Killings, MD;  Location: [facility];  Service: Orthopedics;  Laterality: N/A;  C5-6, C6-7 ACD&F, ALLOGRAFT AND PLATES  . [place]     OB History   None      Home Medications    Prior to Admission medications   Medication Sig Start Date End Date Taking? Authorizing Provider  amLODipine (NORVASC) 5 MG tablet Take 1 tablet (5 mg total) by mouth daily. 05/28/16   Nicolette Bang, DO  asenapine (SAPHRIS) 5 MG SUBL 24 hr tablet Place 5 mg under the tongue 2  (two) times daily.    [provider]  aspirin 81 MG EC tablet TAKE 1 TABLET (81 MG TOTAL) BY MOUTH DAILY. 03/23/17   Nicolette Bang, DO  atorvastatin (LIPITOR) 20 MG tablet TAKE 1 TABLET BY MOUTH EVERY DAY 03/23/17   Nicolette Bang, DO  benzonatate (TESSALON) 100 MG capsule Take 1 capsule (100 mg total) by mouth 3 (three) times daily as needed for cough. 03/22/17   Nicolette Bang, DO  [facility] MELATONIN 3 MG TABS TAKE 1 TABLET (3 MG TOTAL) BY MOUTH AT BEDTIME. 03/23/17   Nicolette Bang, DO  diclofenac (VOLTAREN) 75 MG EC tablet TAKE 1 TABLET BY MOUTH TWICE A DAY 03/23/17   Nicolette Bang, DO  fluticasone [facility]) 50 MCG/ACT nasal spray Place 2 sprays into both nostrils daily. 10/20/16   Nicolette Bang, DO  losartan-hydrochlorothiazide (HYZAAR) 100-12.5 MG tablet Take 1 tablet by mouth daily. 05/28/16   Nicolette Bang, DO  nystatin (MYCOSTATIN/NYSTOP) powder Apply topically 3 (three) times daily. 08/10/16   Ward, Ozella Almond, PA-C  Olopatadine HCl 0.2 % SOLN Apply 1 drop to eye daily. 08/10/16   Ward, Ozella Almond, PA-C  oxymetazoline (AFRIN) 0.05 % nasal spray Place 1 spray into both nostrils 2 (two) times daily. 10/20/16   Nicolette Bang, DO  ranitidine (ZANTAC) 150 MG tablet TAKE 1 TABLET BY MOUTH AT BEDTIME 07/29/16   Juleen China,  Bayard Beaver, DO  silver sulfADIAZINE (SILVADENE) 1 % cream Apply 1 application topically daily. 02/23/17   Maczis, Barth Kirks, PA-C  venlafaxine XR (EFFEXOR-XR) 75 MG 24 hr capsule TAKE 1 CAPSULE (75 MG TOTAL) BY MOUTH DAILY WITH BREAKFAST. 07/02/16   Nicolette Bang, DO    Family History Family History  Problem Relation Age of Onset  . Hypertension Father   . Cancer Father     Social History Social History   Tobacco Use  . Smoking status: Former Smoker    Packs/day: 0.25    Years: 20.00    Pack years: 5.00    Types: Cigarettes    Last attempt to quit: 08/02/1998     Years since quitting: 19.0  . Smokeless tobacco: Never Used  Substance Use Topics  . Alcohol use: No  . Drug use: Yes    Frequency: 21.0 times per week    Types: Marijuana    Comment: 3x daily     Allergies   Patient has no known allergies.   Review of Systems Review of Systems  All other systems reviewed and are negative.    Physical Exam Updated Vital Signs BP (!) 207/98 (BP Location: Left Arm)   Pulse (!) 58   Temp 97.9 F (36.6 C) (Oral)   Resp 18   Ht 5' 3.5" (1.613 m)   Wt 69.4 kg (153 lb)   LMP 07/06/2012   SpO2 100%   BMI 26.68 kg/m   Physical Exam  Constitutional: She is oriented to person, place, and time. She appears well-developed and well-nourished.  HENT:  Head: Normocephalic.  Eyes: EOM are normal.  Neck: Normal range of motion.  Pulmonary/Chest: Effort normal.  Abdominal: She exhibits no distension.  Musculoskeletal: Normal range of motion.  Healing abrasion overlying the patella without secondary signs of infection.  Able to range the right knee without significant difficulty.  No obvious effusion noted on examination.  Normal pulses in the right foot.  Compartments of the right lower extremity are soft  Neurological: She is alert and oriented to person, place, and time.  Psychiatric: She has a normal mood and affect.  Nursing note and vitals reviewed.    ED Treatments / Results  Labs (all labs ordered are listed, but only abnormal results are displayed) Labs Reviewed - No data to display  EKG None  Radiology Ct Knee Right Wo Contrast  Result Date: 09/02/2017 CLINICAL DATA:  [place] 2 days ago.  Persistent pain and swelling. EXAM: CT OF THE right KNEE WITHOUT CONTRAST TECHNIQUE: Multidetector CT imaging of the right knee was performed according to the standard protocol. Multiplanar CT image reconstructions were also generated. COMPARISON:  Radiograph 09/02/2017 FINDINGS: The joint spaces are maintained. No acute fractures identified. No  osteochondral lesion. No joint effusion. Subcutaneous edema anteriorly probably from a direct contusion. No discrete large hematoma. Grossly by CT the cruciate and collateral ligaments appear intact. The quadriceps and patellar tendons are intact. IMPRESSION: 1. No acute fracture. 2. No joint effusion or Baker's cyst. 3. Grossly by CT the major ligaments and tendons appear intact. 4. Prepatellar tendon soft tissue thickening and edema likely from direct contusion without discrete large hematoma. Electronically Signed   By: Marijo Sanes M.D.   On: 09/02/2017 15:09   Dg Knee Complete 4 Views Right  Result Date: 09/02/2017 CLINICAL DATA:  Knee pain and swelling. EXAM: RIGHT KNEE - COMPLETE 4+ VIEW COMPARISON:  12/25/2007. FINDINGS: No acute bony or joint abnormality identified. No evidence of  fracture or dislocation. Small knee joint effusion cannot be excluded. IMPRESSION: Small knee joint effusion cannot be excluded. 2.  No acute bony abnormality identified. 3.  Peripheral vascular disease. Electronically Signed   By: Marcello Moores  Register   On: 09/02/2017 13:35    Procedures Procedures (including critical care time)  Medications Ordered in ED Medications - No data to display   Initial Impression / Assessment and Plan / ED Course  I have reviewed the triage vital signs and the nursing notes.  Pertinent labs & imaging results that were available during my care of the patient were reviewed by me and considered in my medical decision making (see chart for details).     X-ray and CT imaging negative for acute fracture.  Outpatient primary care follow-up.  Rest ice and anti-inflammatories recommended.  Final Clinical Impressions(s) / ED Diagnoses   Final diagnoses:  Acute pain of right knee    ED Discharge Orders    None       Jola Schmidt, MD 09/02/17 1539

## 2017-09-02 NOTE — Discharge Instructions (Addendum)
Ibuprofen and Tylenol for pain  Your x-ray and CT scan of the right knee demonstrate no broken bone or obvious abnormality  Please follow-up with your primary care physician if your pain continues.

## 2017-09-05 ENCOUNTER — Other Ambulatory Visit: Payer: Self-pay

## 2017-09-05 DIAGNOSIS — G894 Chronic pain syndrome: Secondary | ICD-10-CM

## 2017-09-05 DIAGNOSIS — I10 Essential (primary) hypertension: Secondary | ICD-10-CM

## 2017-09-05 MED ORDER — LOSARTAN POTASSIUM-HCTZ 100-12.5 MG PO TABS: 1.0000 | ORAL_TABLET | Freq: Every day | ORAL | 0 refills | Status: DC

## 2017-09-05 MED ORDER — AMLODIPINE BESYLATE 5 MG PO TABS: 5.0000 mg | ORAL_TABLET | Freq: Every day | ORAL | 0 refills | Status: DC

## 2017-09-05 NOTE — Telephone Encounter (Signed)
Will refill BP meds, but patient overdue for appt with me. Please call and have patient seen as soon as they can for BP follow up and labs.

## 2017-09-06 MED ORDER — VENLAFAXINE HCL ER 75 MG PO CP24: 75.0000 mg | ORAL_CAPSULE | Freq: Every day | ORAL | 5 refills | Status: DC

## 2017-09-07 NOTE — Telephone Encounter (Signed)
Pt has appointment scheduled for 10/19/17 with PCP. Katharina Caper, April D, [place]

## 2017-10-14 ENCOUNTER — Emergency Department (HOSPITAL_COMMUNITY)
Admission: EM | Admit: 2017-10-14 | Discharge: 2017-10-14 | Disposition: A | Discharge status: Home / Self Care | Payer: Medicare Other | Attending: Emergency Medicine | Admitting: Emergency Medicine

## 2017-10-14 ENCOUNTER — Encounter (HOSPITAL_COMMUNITY): Payer: Self-pay

## 2017-10-14 ENCOUNTER — Other Ambulatory Visit: Payer: Self-pay

## 2017-10-14 DIAGNOSIS — H9202 Otalgia, left ear: Secondary | ICD-10-CM | POA: Insufficient documentation

## 2017-10-14 DIAGNOSIS — Z79899 Other long term (current) drug therapy: Secondary | ICD-10-CM | POA: Insufficient documentation

## 2017-10-14 DIAGNOSIS — Z87891 Personal history of nicotine dependence: Secondary | ICD-10-CM | POA: Insufficient documentation

## 2017-10-14 DIAGNOSIS — I110 Hypertensive heart disease with heart failure: Secondary | ICD-10-CM | POA: Insufficient documentation

## 2017-10-14 DIAGNOSIS — I5032 Chronic diastolic (congestive) heart failure: Secondary | ICD-10-CM | POA: Diagnosis not present

## 2017-10-14 DIAGNOSIS — Z7982 Long term (current) use of aspirin: Secondary | ICD-10-CM | POA: Diagnosis not present

## 2017-10-14 MED ORDER — AMOXICILLIN 500 MG PO CAPS: 500.0000 mg | ORAL_CAPSULE | Freq: Two times a day (BID) | ORAL | 0 refills | Status: DC

## 2017-10-14 MED ORDER — CETIRIZINE HCL 10 MG PO CAPS: 1.0000 | ORAL_CAPSULE | Freq: Every day | ORAL | 0 refills | Status: DC

## 2017-10-14 NOTE — Discharge Instructions (Signed)
Take amoxicillin as prescribed for 7 days.  Take Zyrtec once daily as prescribed for allergy symptoms.  Use the Flonase that you have at home as prescribed daily as well.  Please follow-up with your doctor as scheduled next week and have them recheck your ear.  Please return the emergency department if you develop any new or worsening symptoms.

## 2017-10-14 NOTE — ED Triage Notes (Signed)
She c/o left earache x 3 days. She is in no distress.

## 2017-10-14 NOTE — ED Provider Notes (Signed)
[facility] DEPT Provider Note   CSN: [id number] Arrival date & time: 10/14/17  1416     History   Chief Complaint Chief Complaint  Patient presents with  . Otalgia    HPI Debbie Clarke is a 57 y.o. female with history of GERD, hypertension, fibromyalgia who presents with a 3-day history of left ear pain.  Patient reports she has had problems with her allergies lately.  She reports she is supposed to be using Flonase, however has not yet.  She reports her left ear hurts when she moves her jaw and swallows.  She denies any fevers or cough, but has had some congestion at home.  She has no history of ear infection.  HPI  Past Medical History:  Diagnosis Date  . Arthritis   . Blood dyscrasia    sickle cell trait  . Fibromyalgia   . GERD (gastroesophageal reflux disease)   . Hx of sickle cell trait   . Hypertension     Patient Active Problem List   Diagnosis Date Noted  . Mental health disorder 08/28/2015  . Insomnia 04/14/2015  . Chronic diastolic congestive heart failure ([place]) 01/17/2015  . Auditory hallucinations 08/30/2013  . Diffuse pain 07/27/2013  . Spinal stenosis in cervical region 08/08/2012    Class: Diagnosis of  . HYPERTENSION, BENIGN ESSENTIAL 04/27/2010    Past Surgical History:  Procedure Laterality Date  . ANTERIOR CERVICAL DECOMP/DISCECTOMY FUSION N/A 08/07/2012   Procedure: ANTERIOR CERVICAL DECOMPRESSION/DISCECTOMY FUSION 2 LEVELS;  Surgeon: Marybelle Killings, MD;  Location: [facility];  Service: Orthopedics;  Laterality: N/A;  C5-6, C6-7 ACD&F, ALLOGRAFT AND PLATES  . [place]     OB History   None      Home Medications    Prior to Admission medications   Medication Sig Start Date End Date Taking? Authorizing Provider  amLODipine (NORVASC) 5 MG tablet Take 1 tablet (5 mg total) by mouth daily. 09/05/17   Sela Hilding, MD  amoxicillin (AMOXIL) 500 MG capsule Take 1 capsule (500 mg total) by  mouth 2 (two) times daily. 10/14/17   Avaeh Ewer, Bea Graff, PA-C  asenapine (SAPHRIS) 5 MG SUBL 24 hr tablet Place 5 mg under the tongue 2 (two) times daily.    [provider]  aspirin 81 MG EC tablet TAKE 1 TABLET (81 MG TOTAL) BY MOUTH DAILY. 03/23/17   Nicolette Bang, DO  atorvastatin (LIPITOR) 20 MG tablet TAKE 1 TABLET BY MOUTH EVERY DAY 03/23/17   Nicolette Bang, DO  benzonatate (TESSALON) 100 MG capsule Take 1 capsule (100 mg total) by mouth 3 (three) times daily as needed for cough. 03/22/17   Nicolette Bang, DO  Cetirizine HCl (ZYRTEC ALLERGY) 10 MG CAPS Take 1 capsule (10 mg total) by mouth daily. 10/14/17   Frederica Kuster, PA-C  [facility] MELATONIN 3 MG TABS TAKE 1 TABLET (3 MG TOTAL) BY MOUTH AT BEDTIME. 03/23/17   Nicolette Bang, DO  diclofenac (VOLTAREN) 75 MG EC tablet TAKE 1 TABLET BY MOUTH TWICE A DAY 03/23/17   Nicolette Bang, DO  fluticasone [facility]) 50 MCG/ACT nasal spray Place 2 sprays into both nostrils daily. 10/20/16   Nicolette Bang, DO  losartan-hydrochlorothiazide (HYZAAR) 100-12.5 MG tablet Take 1 tablet by mouth daily. 09/05/17   Sela Hilding, MD  nystatin (MYCOSTATIN/NYSTOP) powder Apply topically 3 (three) times daily. 08/10/16   Ward, Ozella Almond, PA-C  Olopatadine HCl 0.2 % SOLN Apply 1 drop  to eye daily. 08/10/16   Ward, Ozella Almond, PA-C  oxymetazoline (AFRIN) 0.05 % nasal spray Place 1 spray into both nostrils 2 (two) times daily. 10/20/16   Nicolette Bang, DO  ranitidine (ZANTAC) 150 MG tablet TAKE 1 TABLET BY MOUTH AT BEDTIME 07/29/16   Nicolette Bang, DO  silver sulfADIAZINE (SILVADENE) 1 % cream Apply 1 application topically daily. 02/23/17   Maczis, Barth Kirks, PA-C  venlafaxine XR (EFFEXOR-XR) 75 MG 24 hr capsule Take 1 capsule (75 mg total) by mouth daily with breakfast. 09/06/17   Sela Hilding, MD    Family History Family History  Problem Relation Age of Onset  .  Hypertension Father   . Cancer Father     Social History Social History   Tobacco Use  . Smoking status: Former Smoker    Packs/day: 0.25    Years: 20.00    Pack years: 5.00    Types: Cigarettes    Last attempt to quit: 08/02/1998    Years since quitting: 19.2  . Smokeless tobacco: Never Used  Substance Use Topics  . Alcohol use: No  . Drug use: Yes    Frequency: 21.0 times per week    Types: Marijuana    Comment: 3x daily     Allergies   Patient has no known allergies.   Review of Systems Review of Systems  Constitutional: Negative for fever.  HENT: Positive for congestion and ear pain. Negative for sore throat.   Respiratory: Negative for cough and shortness of breath.   Cardiovascular: Negative for chest pain.     Physical Exam Updated Vital Signs BP (!) 163/96 (BP Location: Left Arm)   Pulse 69   Temp 98 F (36.7 C) (Oral)   Resp 16   LMP 07/06/2012   SpO2 99%   Physical Exam  Constitutional: She appears well-developed and well-nourished. No distress.  HENT:  Head: Normocephalic and atraumatic.  Right Ear: Tympanic membrane normal. No mastoid tenderness.  Left Ear: No mastoid tenderness. Tympanic membrane is erythematous. Tympanic membrane is not perforated, not retracted and not bulging.  Mouth/Throat: Oropharynx is clear and moist. No oropharyngeal exudate, posterior oropharyngeal edema, posterior oropharyngeal erythema or tonsillar abscesses.  Eyes: Pupils are equal, round, and reactive to light. Conjunctivae are normal. Right eye exhibits no discharge. Left eye exhibits no discharge. No scleral icterus.  Neck: Normal range of motion. Neck supple. No thyromegaly present.  Cardiovascular: Normal rate, regular rhythm, normal heart sounds and intact distal pulses. Exam reveals no gallop and no friction rub.  No murmur heard. Pulmonary/Chest: Effort normal and breath sounds normal. No stridor. No respiratory distress. She has no wheezes. She has no rales.    Abdominal: Soft. Bowel sounds are normal. She exhibits no distension. There is no tenderness. There is no rebound and no guarding.  Musculoskeletal: She exhibits no edema.  Lymphadenopathy:    She has no cervical adenopathy.  Neurological: She is alert. Coordination normal.  Skin: Skin is warm and dry. No rash noted. She is not diaphoretic. No pallor.  Psychiatric: She has a normal mood and affect.  Nursing note and vitals reviewed.    ED Treatments / Results  Labs (all labs ordered are listed, but only abnormal results are displayed) Labs Reviewed - No data to display  EKG None  Radiology No results found.  Procedures Procedures (including critical care time)  Medications Ordered in ED Medications - No data to display   Initial Impression / Assessment and Plan / ED  Course  I have reviewed the triage vital signs and the nursing notes.  Pertinent labs & imaging results that were available during my care of the patient were reviewed by me and considered in my medical decision making (see chart for details).     Patient with left otalgia.  Left TM is mildly erythematous and cone of light is displaced in comparison to the right.  Suspect patient's symptoms may be related to allergies, however considering inflammatory appearance, will cover with amoxicillin, but treat with Zyrtec and Flonase.  Patient has appointment with PCP next week.  Advised recheck at that time as well as recheck her blood pressure.  Return precautions discussed.  Patient understands and agrees with plan.  Patient discharged in satisfactory condition.  Patient also evaluated by Dr. Francia Greaves who agrees with plan.  Final Clinical Impressions(s) / ED Diagnoses   Final diagnoses:  Left ear pain    ED Discharge Orders         Ordered    amoxicillin (AMOXIL) 500 MG capsule  2 times daily,   Status:  Discontinued     10/14/17 1431    Cetirizine HCl (ZYRTEC ALLERGY) 10 MG CAPS  Daily     10/14/17 1431     amoxicillin (AMOXIL) 500 MG capsule  2 times daily     10/14/17 [address] [place] 10/14/17 1434    Valarie Merino, MD 10/15/17 [phone]

## 2017-10-19 ENCOUNTER — Encounter: Payer: Medicare Other | Admitting: Family Medicine

## 2017-11-25 DIAGNOSIS — H2513 Age-related nuclear cataract, bilateral: Secondary | ICD-10-CM | POA: Diagnosis not present

## 2017-11-25 DIAGNOSIS — H25013 Cortical age-related cataract, bilateral: Secondary | ICD-10-CM | POA: Diagnosis not present

## 2017-11-25 DIAGNOSIS — H04123 Dry eye syndrome of bilateral lacrimal glands: Secondary | ICD-10-CM | POA: Diagnosis not present

## 2017-11-25 DIAGNOSIS — H43812 Vitreous degeneration, left eye: Secondary | ICD-10-CM | POA: Diagnosis not present

## 2017-11-25 DIAGNOSIS — H35033 Hypertensive retinopathy, bilateral: Secondary | ICD-10-CM | POA: Diagnosis not present

## 2017-12-07 ENCOUNTER — Other Ambulatory Visit: Payer: Self-pay

## 2017-12-07 ENCOUNTER — Other Ambulatory Visit: Payer: Self-pay | Admitting: Family Medicine

## 2017-12-07 ENCOUNTER — Ambulatory Visit (INDEPENDENT_AMBULATORY_CARE_PROVIDER_SITE_OTHER): Payer: Medicare Other | Admitting: Family Medicine

## 2017-12-07 ENCOUNTER — Encounter: Payer: Self-pay | Admitting: Family Medicine

## 2017-12-07 VITALS — BP 162/104 | HR 54 | Temp 97.9°F | Ht 63.0 in | Wt 158.4 lb

## 2017-12-07 DIAGNOSIS — I10 Essential (primary) hypertension: Secondary | ICD-10-CM | POA: Diagnosis not present

## 2017-12-07 DIAGNOSIS — M4802 Spinal stenosis, cervical region: Secondary | ICD-10-CM

## 2017-12-07 DIAGNOSIS — M79605 Pain in left leg: Secondary | ICD-10-CM

## 2017-12-07 DIAGNOSIS — F319 Bipolar disorder, unspecified: Secondary | ICD-10-CM | POA: Diagnosis not present

## 2017-12-07 DIAGNOSIS — Z1231 Encounter for screening mammogram for malignant neoplasm of breast: Secondary | ICD-10-CM

## 2017-12-07 DIAGNOSIS — Z1239 Encounter for other screening for malignant neoplasm of breast: Secondary | ICD-10-CM | POA: Diagnosis not present

## 2017-12-07 MED ORDER — CETIRIZINE HCL 10 MG PO CAPS: 1.0000 | ORAL_CAPSULE | Freq: Every day | ORAL | 1 refills | Status: DC

## 2017-12-07 MED ORDER — AMLODIPINE BESYLATE 5 MG PO TABS: 5.0000 mg | ORAL_TABLET | Freq: Every day | ORAL | 2 refills | Status: DC

## 2017-12-07 MED ORDER — ATORVASTATIN CALCIUM 20 MG PO TABS: 20.0000 mg | ORAL_TABLET | Freq: Every day | ORAL | 3 refills | Status: DC

## 2017-12-07 MED ORDER — LOSARTAN POTASSIUM-HCTZ 100-12.5 MG PO TABS: 1.0000 | ORAL_TABLET | Freq: Every day | ORAL | 2 refills | Status: DC

## 2017-12-07 MED ORDER — ASPIRIN 81 MG PO TBEC: DELAYED_RELEASE_TABLET | ORAL | 3 refills | Status: DC

## 2017-12-07 MED ORDER — NYSTATIN 100000 UNIT/GM EX POWD: Freq: Three times a day (TID) | CUTANEOUS | 0 refills | Status: DC

## 2017-12-07 NOTE — Progress Notes (Signed)
CC: mood, HTN, hand numbness  HPI  Scheduled mammo today.   Mood - Was going to [employer]. Dx was bipolar and schizophrenic. Was on saphris and efexxor. Was told she couldn't get another appointment at [facility]. No hx of hospitalization. These meds did help. Stopped one med because it was making her sleepy, she can't recall which one.  Numbness in hands. Thinks this is due to hx of damaged nerve in her neck. Daily hand numbness. Drops things a lot. This is not new. Before the surgery it was bad, but not completely resolved after the surgery. Drops things 3-4 days per week. Surgeon was Dr. Lorin Mercy. Asked several times (patient somewhat tangential), patient states no new changes in numbness or weakness.   Left foot - L leg "goes numb" starting from hip, and then pain "stops at the ball of my L foot." Feels like throbbing and tingling. This has been present about 1 month. Has some burning in bilateral calves with walking distances occasionally. On further questioning, these symptoms have been present for years without change.   HTN - has been out of her meds x 1  Month. No home BPs.   ROS: Denies CP, SOB, abdominal pain, dysuria, changes in BMs.   CC, SH/smoking status, and VS noted  Objective: BP (!) 162/104   Pulse (!) 54   Temp 97.9 F (36.6 C) (Oral)   Ht 5\' 3"  (1.6 m)   Wt 158 lb 6.4 oz (71.8 kg)   LMP 07/06/2012   SpO2 98%   BMI 28.06 kg/m  Gen: NAD, alert, cooperative, and pleasant. HEENT: NCAT, EOMI, PERRL CV: RRR, no murmur Resp: CTAB, no wheezes, non-labored Ext: No edema, warm. Normal sensation to touch in bilateral lower legs and feet. Normal gait. Normal grip strength and resisted finger spread strength bilaterally.  Neuro: Alert and oriented, Speech clear, No gross deficits  Assessment and plan:  HYPERTENSION, BENIGN ESSENTIAL Restart hyzaar and norvasc, check BP at home or pharmacy. If low, call. Get BMP today, return in 10 days for RN BP check and repeat BMP, future  order placed.  Spinal stenosis in cervical region Patient c/w persistent dropping of items. States this has been present for years. Based on exam today with normal strength and sensation, no acute need for imaging. Encouraged her to check in with her surgeon if she is concerned.   Bipolar disorder ([place]) No current medications. Placed referral back to psych. Also encouraged her to call [facility] to be seen there again (her story regarding why she is not being seen there is odd). No SI or HI or hallucinations at present.   Leg pain, left Patient with tangential history and hx of DDD in cervical region. Certainly could be describing sciatica symptoms. Normal strength, sensation, and gait today. Recheck in 1 month, continue walking as able.    Orders Placed This Encounter  Procedures  . CBC  . Basic metabolic panel  . Basic metabolic panel    Standing Status:   Future    Standing Expiration Date:   12/10/2018  . Ambulatory referral to Psychiatry    Referral Priority:   Routine    Referral Type:   Psychiatric    Referral Reason:   Specialty Services Required    Requested Specialty:   Psychiatry    Number of Visits Requested:   1    Meds ordered this encounter  Medications  . amLODipine (NORVASC) 5 MG tablet    Sig: Take 1 tablet (5 mg  total) by mouth daily.    Dispense:  90 tablet    Refill:  2  . atorvastatin (LIPITOR) 20 MG tablet    Sig: Take 1 tablet (20 mg total) by mouth daily.    Dispense:  90 tablet    Refill:  3  . aspirin 81 MG EC tablet    Sig: TAKE 1 TABLET (81 MG TOTAL) BY MOUTH DAILY.    Dispense:  90 tablet    Refill:  3  . Cetirizine HCl (ZYRTEC ALLERGY) 10 MG CAPS    Sig: Take 1 capsule (10 mg total) by mouth daily.    Dispense:  90 capsule    Refill:  1  . losartan-hydrochlorothiazide (HYZAAR) 100-12.5 MG tablet    Sig: Take 1 tablet by mouth daily.    Dispense:  90 tablet    Refill:  2  . nystatin (MYCOSTATIN/NYSTOP) powder    Sig: Apply topically 3  (three) times daily.    Dispense:  15 g    Refill:  0    Health Maintenance reviewed - mammogram ordered, patient to schedule appointment.  Ralene Ok, MD, PGY3 12/09/2017 10:37 AM

## 2017-12-07 NOTE — Patient Instructions (Addendum)
It was a pleasure to see you today! Thank you for choosing [facility] for your primary care. Debbie Clarke was seen for blood pressure.   Our plans for today were:  Restart both of your blood pressure medicines. Make an appt on the way out to see my nurse in 1 week to recheck your blood pressure and labs. Call us if you feel badly or are worried about your blood pressure before then.   You scheduled your mammogram.   I am placing a referral for mood again. Please answer any 336 calls.   Call Dr. Lorin Mercy about your hands.   Best,  Dr. Lindell Noe

## 2017-12-08 LAB — BASIC METABOLIC PANEL
BUN / CREAT RATIO: 11 (ref 9–23)
BUN: 8 mg/dL (ref 6–24)
CO2: 27 mmol/L (ref 20–29)
CREATININE: 0.71 mg/dL (ref 0.57–1.00)
Calcium: 9.3 mg/dL (ref 8.7–10.2)
Chloride: 105 mmol/L (ref 96–106)
GFR calc Af Amer: 110 mL/min/{1.73_m2} (ref >59)
GFR calc non Af Amer: 96 mL/min/{1.73_m2} (ref >59)
Glucose: 94 mg/dL (ref 65–99)
Potassium: 3.7 mmol/L (ref 3.5–5.2)
Sodium: 147 mmol/L — ABNORMAL HIGH (ref 134–144)

## 2017-12-08 LAB — CBC
HEMATOCRIT: 32.7 % — AB (ref 34.0–46.6)
HEMOGLOBIN: 11.1 g/dL (ref 11.1–15.9)
MCH: 25.6 pg — ABNORMAL LOW (ref 26.6–33.0)
MCHC: 33.9 g/dL (ref 31.5–35.7)
MCV: 76 fL — ABNORMAL LOW (ref 79–97)
Platelets: 269 10*3/uL (ref 150–450)
RBC: 4.33 x10E6/uL (ref 3.77–5.28)
RDW: 16 % — AB (ref 12.3–15.4)
WBC: 4.2 10*3/uL (ref 3.4–10.8)

## 2017-12-09 DIAGNOSIS — M79605 Pain in left leg: Secondary | ICD-10-CM | POA: Insufficient documentation

## 2017-12-09 DIAGNOSIS — F319 Bipolar disorder, unspecified: Secondary | ICD-10-CM | POA: Insufficient documentation

## 2017-12-09 NOTE — Assessment & Plan Note (Signed)
No current medications. Placed referral back to psych. Also encouraged her to call [facility] to be seen there again (her story regarding why she is not being seen there is odd). No SI or HI or hallucinations at present.

## 2017-12-09 NOTE — Assessment & Plan Note (Signed)
Restart hyzaar and norvasc, check BP at home or pharmacy. If low, call. Get BMP today, return in 10 days for RN BP check and repeat BMP, future order placed.

## 2017-12-09 NOTE — Assessment & Plan Note (Signed)
Patient with tangential history and hx of DDD in cervical region. Certainly could be describing sciatica symptoms. Normal strength, sensation, and gait today. Recheck in 1 month, continue walking as able.

## 2017-12-09 NOTE — Assessment & Plan Note (Signed)
Patient c/w persistent dropping of items. States this has been present for years. Based on exam today with normal strength and sensation, no acute need for imaging. Encouraged her to check in with her surgeon if she is concerned.

## 2017-12-14 ENCOUNTER — Ambulatory Visit: Payer: Medicare Other

## 2017-12-14 ENCOUNTER — Other Ambulatory Visit: Payer: Self-pay | Admitting: Family Medicine

## 2017-12-14 ENCOUNTER — Other Ambulatory Visit: Payer: Medicare Other

## 2017-12-14 VITALS — BP 178/110 | HR 68

## 2017-12-14 DIAGNOSIS — I10 Essential (primary) hypertension: Secondary | ICD-10-CM

## 2017-12-14 MED ORDER — VALSARTAN-HYDROCHLOROTHIAZIDE 160-25 MG PO TABS: 1.0000 | ORAL_TABLET | Freq: Every day | ORAL | 0 refills | Status: DC

## 2017-12-14 NOTE — Progress Notes (Signed)
   Patient in to nurse clinic for BP check. Has NOT taken her Amlodipine 5 mg today. Has not taken her Losartan/HCTZ because [facility]. Has not had since July. Same medication was sent on 10/23 and patient was unable to fill.  BP is 178/110, pulse is 68. Patient does state she has a headache.  Preceptor, Dr. Mingo Amber, sent in [place] to pharmacy for patient to begin taking. Appt made for next week with white team provider (no availability with PCP). Patient will call nurse clinic if has any problem [facility]. Patient had labs drawn.  Danley Danker, RN [facility] RN)

## 2017-12-14 NOTE — Progress Notes (Signed)
Precepting today.  Patient here for BP recheck/nurse visit.  Elevated still today.  Has not yet taken Norvasc.  Has been unable to pick up losartan-HCTZ at pharmacy due to recall status.    Plan will be to switch to valsartan (ARB covered by Medicaid)-HCTZ combination.  Creatinine good.  FU in next 5 days or so for BP recheck.

## 2017-12-15 ENCOUNTER — Telehealth: Payer: Self-pay | Admitting: Family Medicine

## 2017-12-15 LAB — BASIC METABOLIC PANEL
BUN / CREAT RATIO: 13 (ref 9–23)
BUN: 10 mg/dL (ref 6–24)
CHLORIDE: 104 mmol/L (ref 96–106)
CO2: 25 mmol/L (ref 20–29)
Calcium: 9.6 mg/dL (ref 8.7–10.2)
Creatinine, Ser: 0.75 mg/dL (ref 0.57–1.00)
GFR calc Af Amer: 103 mL/min/{1.73_m2} (ref >59)
GFR calc non Af Amer: 89 mL/min/{1.73_m2} (ref >59)
GLUCOSE: 88 mg/dL (ref 65–99)
POTASSIUM: 4 mmol/L (ref 3.5–5.2)
SODIUM: 146 mmol/L — AB (ref 134–144)

## 2017-12-15 NOTE — Telephone Encounter (Signed)
Pt informed of below. Zimmerman Rumple, April D, CMA  

## 2017-12-15 NOTE — Telephone Encounter (Signed)
Labs stable. Please let patient know and have her follow up as scheduled.

## 2017-12-16 ENCOUNTER — Telehealth: Payer: Self-pay

## 2017-12-16 NOTE — Telephone Encounter (Signed)
Patient called to inquire if she is supposed to be taking Venlafaxine, Diclofenac sodium, and Ranitidine. She has the bottles and needs to know if she is to refill.  None are on current med list.  Call back is [phone]  Danley Danker, RN [facility])

## 2017-12-18 DIAGNOSIS — R110 Nausea: Secondary | ICD-10-CM | POA: Diagnosis not present

## 2017-12-18 DIAGNOSIS — R55 Syncope and collapse: Secondary | ICD-10-CM | POA: Diagnosis not present

## 2017-12-18 DIAGNOSIS — R001 Bradycardia, unspecified: Secondary | ICD-10-CM | POA: Diagnosis not present

## 2017-12-19 NOTE — Telephone Encounter (Signed)
Pt informed of below. Zimmerman Rumple, Neyland Pettengill D, CMA  

## 2017-12-19 NOTE — Telephone Encounter (Signed)
She should not be taking any of those. She should be following up with psych or walking in to [facility] for her bipolar disorder. She may have forgotten that this was our plan.

## 2017-12-21 ENCOUNTER — Ambulatory Visit: Payer: Medicare Other | Admitting: Family Medicine

## 2017-12-23 ENCOUNTER — Ambulatory Visit: Payer: Medicare Other | Admitting: Family Medicine

## 2018-01-07 ENCOUNTER — Other Ambulatory Visit: Payer: Self-pay | Admitting: Family Medicine

## 2018-01-09 ENCOUNTER — Other Ambulatory Visit: Payer: Self-pay

## 2018-01-09 ENCOUNTER — Ambulatory Visit (INDEPENDENT_AMBULATORY_CARE_PROVIDER_SITE_OTHER): Payer: Medicare Other | Admitting: Family Medicine

## 2018-01-09 ENCOUNTER — Encounter: Payer: Self-pay | Admitting: Family Medicine

## 2018-01-09 VITALS — BP 110/62 | HR 65 | Temp 97.5°F | Ht 63.0 in | Wt 156.2 lb

## 2018-01-09 DIAGNOSIS — I10 Essential (primary) hypertension: Secondary | ICD-10-CM

## 2018-01-09 DIAGNOSIS — L03031 Cellulitis of right toe: Secondary | ICD-10-CM | POA: Diagnosis not present

## 2018-01-09 DIAGNOSIS — D649 Anemia, unspecified: Secondary | ICD-10-CM | POA: Diagnosis not present

## 2018-01-09 MED ORDER — CEPHALEXIN 500 MG PO CAPS: 500.0000 mg | ORAL_CAPSULE | Freq: Four times a day (QID) | ORAL | 0 refills | Status: AC

## 2018-01-09 MED ORDER — VALSARTAN-HYDROCHLOROTHIAZIDE 160-25 MG PO TABS: 1.0000 | ORAL_TABLET | Freq: Every day | ORAL | 0 refills | Status: DC

## 2018-01-09 NOTE — Progress Notes (Signed)
   CC: R toe pain  HPI  R great toe- started hurting a few days ago (started Friday). Hx of foot fungus, attributes this to a nail salon years ago. Hx of taking terbinafine x 3 months - this was 15-20 years ago. Recently tried some topical antifungals. No trauma. Feels red and swollen. Hurts when anything touches it. Pain is better now than Friday. No hx of gout, no hx of similar presentation.   Findings of low MCV on CBC at last visit - patient reports had a dark stool 2wks ago and then two days later, thought it was 2/2 eating two boxes of milk duds, has sickle cell trait. Has been told she has iron deficiency in the past. No dysphagia, no unintentional weight loss. Colonoscopy was in 2015 and told she could return in 10 years.   HTN - has valsartan HCTZ, tolerating it well, took it today.   ROS: Denies CP, SOB, abdominal pain, dysuria, changes in BMs.   CC, SH/smoking status, and VS noted  Objective: BP 110/62   Pulse 65   Temp (!) 97.5 F (36.4 C) (Oral)   Ht 5\' 3"  (1.6 m)   Wt 156 lb 3.2 oz (70.9 kg)   LMP 07/06/2012   SpO2 98%   BMI 27.67 kg/m  Gen: NAD, alert, cooperative, and pleasant. HEENT: NCAT, EOMI, PERRL CV: RRR, no murmur Resp: CTAB, no wheezes, non-labored Abd: SNTND, BS present, no guarding or organomegaly Ext: No edema, warm. R great toe with 0.5cm erythema and warmth surrounding base of nail (coexisting darkened and thickened nail c/w fungus). TTP around base of nail. No fluctuance or pocket palpated or visualized. Callus over medial aspect of R great toe without warmth, erythema, or pain.  Neuro: Alert and oriented, Speech clear, No gross deficits  Assessment and plan:  HYPERTENSION, BENIGN ESSENTIAL Continue valsartan HCTZ, recheck BMP today. Refilled.   Anemia Pt reports hx of sickle cell trait, had one isolated event of dark stools 2 weeks ago, UTD on colonoscopy, will get ferritin and repeat CBC to assess ACD vs IDA. Further workup pending lab results.     Paronychia of great toe of right foot Exam c/w this, no pocket I can appreciate to drain. Given that she has tried soaking and supportive care x4 days so far, we will treat with p.o. antibiotics.  Return precautions given for worsening pain, redness, fever, visualization of pus.  Would consider stab incision if occurs at future visit. - cephALEXin (KEFLEX) 500 MG capsule; Take 1 capsule (500 mg total) by mouth 4 (four) times daily for 7 days. Take for 7 days  Dispense: 28 capsule; Refill: 0   Orders Placed This Encounter  Procedures  . CBC  . Ferritin  . Basic metabolic panel    Meds ordered this encounter  Medications  . cephALEXin (KEFLEX) 500 MG capsule    Sig: Take 1 capsule (500 mg total) by mouth 4 (four) times daily for 7 days. Take for 7 days    Dispense:  28 capsule    Refill:  0  . valsartan-hydrochlorothiazide (DIOVAN-HCT) 160-25 MG tablet    Sig: Take 1 tablet by mouth daily.    Dispense:  90 tablet    Refill:  0    Health Maintenance reviewed - mammo already scheduled.  Ralene Ok, MD, PGY3 01/10/2018 8:30 AM

## 2018-01-09 NOTE — Patient Instructions (Signed)
It was a pleasure to see you today! Thank you for choosing [facility] for your primary care. Kailey D Firmin was seen for blood pressure, anemia, toe pain.   Our plans for today were:  Take the antibiotic for your foot, call me if you notice a white spot in it or if it gets worse, or you have fevers or feel bad.   Keep taking your blood pressure medicine.   I will call you with the results of your blood number tests.   Best,  Dr. Lindell Noe

## 2018-01-10 ENCOUNTER — Telehealth: Payer: Self-pay | Admitting: Family Medicine

## 2018-01-10 DIAGNOSIS — D509 Iron deficiency anemia, unspecified: Secondary | ICD-10-CM

## 2018-01-10 DIAGNOSIS — D649 Anemia, unspecified: Secondary | ICD-10-CM | POA: Insufficient documentation

## 2018-01-10 LAB — BASIC METABOLIC PANEL
BUN/Creatinine Ratio: 21 (ref 9–23)
BUN: 14 mg/dL (ref 6–24)
CALCIUM: 9.2 mg/dL (ref 8.7–10.2)
CHLORIDE: 98 mmol/L (ref 96–106)
CO2: 29 mmol/L (ref 20–29)
CREATININE: 0.66 mg/dL (ref 0.57–1.00)
GFR calc Af Amer: 114 mL/min/{1.73_m2} (ref >59)
GFR calc non Af Amer: 99 mL/min/{1.73_m2} (ref >59)
GLUCOSE: 89 mg/dL (ref 65–99)
Potassium: 3.2 mmol/L — ABNORMAL LOW (ref 3.5–5.2)
Sodium: 141 mmol/L (ref 134–144)

## 2018-01-10 LAB — CBC
HEMATOCRIT: 30.7 % — AB (ref 34.0–46.6)
HEMOGLOBIN: 10.4 g/dL — AB (ref 11.1–15.9)
MCH: 25.7 pg — ABNORMAL LOW (ref 26.6–33.0)
MCHC: 33.9 g/dL (ref 31.5–35.7)
MCV: 76 fL — ABNORMAL LOW (ref 79–97)
Platelets: 274 10*3/uL (ref 150–450)
RBC: 4.05 x10E6/uL (ref 3.77–5.28)
RDW: 16.8 % — ABNORMAL HIGH (ref 12.3–15.4)
WBC: 3.8 10*3/uL (ref 3.4–10.8)

## 2018-01-10 LAB — FERRITIN: FERRITIN: 19 ng/mL (ref 15–150)

## 2018-01-10 NOTE — Assessment & Plan Note (Signed)
Pt reports hx of sickle cell trait, had one isolated event of dark stools 2 weeks ago, UTD on colonoscopy, will get ferritin and repeat CBC to assess ACD vs IDA. Further workup pending lab results.

## 2018-01-10 NOTE — Telephone Encounter (Signed)
Called patient re lab results, VM is full. If she calls back, please tell her the following:   Her hemoglobin number was slightly low (not dangerously), but slightly lower than last time (10.4). The extra lab that I got showed that she likely has iron deficiency anemia. When someone is over 50, even if they recently had a normal colonoscopy (hers was normal in 2015), they need to talk to their GI doctor about getting another colonoscopy to make sure that there isn't a small polyp causing some slight bleeding into her bowel movements which would make her hemoglobin go down. Her GI was [facility] - their number is [phone]. She should call and ask for another appointment. I will place another referral just in case she needs one.

## 2018-01-10 NOTE — Assessment & Plan Note (Signed)
Continue valsartan HCTZ, recheck BMP today. Refilled.

## 2018-01-17 ENCOUNTER — Ambulatory Visit
Admission: RE | Admit: 2018-01-17 | Discharge: 2018-01-17 | Disposition: A | Discharge status: Home / Self Care | Payer: Medicare Other | Source: Ambulatory Visit | Attending: Family Medicine | Admitting: Family Medicine

## 2018-01-17 DIAGNOSIS — Z1231 Encounter for screening mammogram for malignant neoplasm of breast: Secondary | ICD-10-CM

## 2018-01-19 NOTE — Telephone Encounter (Signed)
Spoke to pt. She will call her GI Dr. Also, made her an apt for next Wed to have her Toe looked at to make sure it is getting better. Ottis Stain, CMA

## 2018-01-25 ENCOUNTER — Ambulatory Visit: Payer: Medicare Other | Admitting: Family Medicine

## 2018-04-05 ENCOUNTER — Other Ambulatory Visit: Payer: Self-pay | Admitting: Gastroenterology

## 2018-04-05 DIAGNOSIS — R1310 Dysphagia, unspecified: Secondary | ICD-10-CM | POA: Diagnosis not present

## 2018-04-05 DIAGNOSIS — K219 Gastro-esophageal reflux disease without esophagitis: Secondary | ICD-10-CM | POA: Diagnosis not present

## 2018-04-05 DIAGNOSIS — D509 Iron deficiency anemia, unspecified: Secondary | ICD-10-CM | POA: Diagnosis not present

## 2018-04-05 DIAGNOSIS — R1319 Other dysphagia: Secondary | ICD-10-CM

## 2018-04-06 ENCOUNTER — Ambulatory Visit
Admission: RE | Admit: 2018-04-06 | Discharge: 2018-04-06 | Disposition: A | Discharge status: Home / Self Care | Payer: Medicare Other | Source: Ambulatory Visit | Attending: Gastroenterology | Admitting: Gastroenterology

## 2018-04-06 DIAGNOSIS — R1319 Other dysphagia: Secondary | ICD-10-CM

## 2018-04-06 DIAGNOSIS — R1314 Dysphagia, pharyngoesophageal phase: Secondary | ICD-10-CM | POA: Diagnosis not present

## 2018-04-06 DIAGNOSIS — R1310 Dysphagia, unspecified: Secondary | ICD-10-CM

## 2018-04-19 DIAGNOSIS — D509 Iron deficiency anemia, unspecified: Secondary | ICD-10-CM | POA: Diagnosis not present

## 2018-04-20 DIAGNOSIS — D509 Iron deficiency anemia, unspecified: Secondary | ICD-10-CM | POA: Diagnosis not present

## 2018-04-20 DIAGNOSIS — D573 Sickle-cell trait: Secondary | ICD-10-CM | POA: Diagnosis not present

## 2018-05-01 ENCOUNTER — Encounter (HOSPITAL_COMMUNITY): Payer: Self-pay | Admitting: Emergency Medicine

## 2018-05-01 ENCOUNTER — Emergency Department (HOSPITAL_COMMUNITY): Payer: Medicare Other

## 2018-05-01 ENCOUNTER — Emergency Department (HOSPITAL_COMMUNITY)
Admission: EM | Admit: 2018-05-01 | Discharge: 2018-05-01 | Disposition: A | Discharge status: Home / Self Care | Payer: Medicare Other | Attending: Emergency Medicine | Admitting: Emergency Medicine

## 2018-05-01 ENCOUNTER — Other Ambulatory Visit: Payer: Self-pay

## 2018-05-01 ENCOUNTER — Telehealth: Payer: Self-pay

## 2018-05-01 DIAGNOSIS — I5032 Chronic diastolic (congestive) heart failure: Secondary | ICD-10-CM | POA: Diagnosis not present

## 2018-05-01 DIAGNOSIS — J069 Acute upper respiratory infection, unspecified: Secondary | ICD-10-CM | POA: Diagnosis not present

## 2018-05-01 DIAGNOSIS — F17210 Nicotine dependence, cigarettes, uncomplicated: Secondary | ICD-10-CM | POA: Insufficient documentation

## 2018-05-01 DIAGNOSIS — Z79899 Other long term (current) drug therapy: Secondary | ICD-10-CM | POA: Insufficient documentation

## 2018-05-01 DIAGNOSIS — B9789 Other viral agents as the cause of diseases classified elsewhere: Secondary | ICD-10-CM

## 2018-05-01 DIAGNOSIS — I110 Hypertensive heart disease with heart failure: Secondary | ICD-10-CM | POA: Diagnosis not present

## 2018-05-01 DIAGNOSIS — D573 Sickle-cell trait: Secondary | ICD-10-CM | POA: Diagnosis not present

## 2018-05-01 DIAGNOSIS — R05 Cough: Secondary | ICD-10-CM | POA: Diagnosis not present

## 2018-05-01 MED ORDER — ALBUTEROL SULFATE HFA 108 (90 BASE) MCG/ACT IN AERS: 1.0000 | INHALATION_SPRAY | Freq: Four times a day (QID) | RESPIRATORY_TRACT | 0 refills | Status: DC | PRN

## 2018-05-01 NOTE — Telephone Encounter (Signed)
Pt called nurse line stating she went to her normal [facility] on Thursday in [name], and ever since Sunday around 3am, she has been experiencing a dry cough, SOB with exertion, Pt gave the example, "hard to make it up my stairs." Pt stated she does not have a thermometer, but I have been breaking out in a cold sweat. Pt stated these symptoms are new to her, she has never had issues walking up her stairs. Pt has no idea if she was exposed to someone with COVID at this time. Pt stated I have not spoken to anyone from [facility]. Pt has had no air travel and has not been out of [place]. Pt stated I heard you cant go to the ER now if you are having symptoms.   Please call her at [phone], or I can call to scheudle a regular office visit.

## 2018-05-01 NOTE — Discharge Instructions (Addendum)
Your symptoms are most likely from a virus that has caused an upper respiratory infection. Possible influenza. A viral illness typically peaks on day 2-3 and resolves after one week.     The main treatment approach for a viral upper respiratory infection is to treat the symptoms, support your immune system and prevent spread of illness.    Stay well-hydrated. Rest. You can use over the counter medications to help with symptoms: 600 mg ibuprofen (motrin, aleve, advil) or acetaminophen (tylenol) every 6 hours, around the clock to help with associated fevers, sore throat, headaches, generalized body aches and malaise.  Oxymetazoline (afrin) intranasal spray once daily for no more than 3 days to help with congestion, after 3 days you can switch to another over-the-counter nasal steroid spray such as fluticasone (flonase) Allergy medication (loratadine, cetirizine, etc) and phenylephrine (sudafed) help with nasal congestion, runny nose and postnasal drip.   Dextromethorphan (Delsym) to suppress dry cough  Guaifenesin (mucinex) to help with built up mucus in chest and productive cough Wash your hands often to prevent spread.  Albuterol inhaler every 4-6 hours may help with cough   A viral upper respiratory infection can also worsen and progress into pneumonia.  Monitor your symptoms. Return for persistent fevers, chest pain, productive cough, inability to tolerate fluids despite nausea medicines or dehydration

## 2018-05-01 NOTE — ED Triage Notes (Signed)
Pt with URI complaints of cough and chills taking otc meds since Friday. Afebrile at triage.

## 2018-05-01 NOTE — ED Provider Notes (Addendum)
[facility] EMERGENCY DEPARTMENT Provider Note   CSN: [id number] Arrival date & time: 05/01/18  1815    History   Chief Complaint Chief Complaint  Patient presents with  . URI    HPI Debbie Clarke is a 58 y.o. female is here for evaluation of cough.  Onset Friday. Described as dry, persistent, disruptive.  She went to [facility] on Thursday and noticed several people coughing there.  Associated with nasal congestion, rhinorrhea, fever 101 on Friday but none since, and shortness of breath. She described the SOB as not being able to breathe through her nose so now has to breathe through her mouth.  Denies exertional CP and SOB, headaches, sore throat, nausea, vomiting, abdominal pain, diarrhea.  No recent travel. No exposure to suspected COVID 19 patients. Last tobacco use 20 years ago. Continues to smoke marijuana joints, last PTA. Took nyquil with transient relief of symptoms.     HPI  Past Medical History:  Diagnosis Date  . Arthritis   . Blood dyscrasia    sickle cell trait  . Fibromyalgia   . GERD (gastroesophageal reflux disease)   . Hx of sickle cell trait   . Hypertension     Patient Active Problem List   Diagnosis Date Noted  . Anemia 01/10/2018  . Bipolar disorder ([place]) 12/09/2017  . Leg pain, left 12/09/2017  . Mental health disorder 08/28/2015  . Insomnia 04/14/2015  . Chronic diastolic congestive heart failure ([place]) 01/17/2015  . Auditory hallucinations 08/30/2013  . Diffuse pain 07/27/2013  . Spinal stenosis in cervical region 08/08/2012    Class: Diagnosis of  . HYPERTENSION, BENIGN ESSENTIAL 04/27/2010    Past Surgical History:  Procedure Laterality Date  . ANTERIOR CERVICAL DECOMP/DISCECTOMY FUSION N/A 08/07/2012   Procedure: ANTERIOR CERVICAL DECOMPRESSION/DISCECTOMY FUSION 2 LEVELS;  Surgeon: Marybelle Killings, MD;  Location: [facility];  Service: Orthopedics;  Laterality: N/A;  C5-6, C6-7 ACD&F, ALLOGRAFT AND PLATES  . [place]     OB History   No obstetric history on file.      Home Medications    Prior to Admission medications   Medication Sig Start Date End Date Taking? Authorizing Provider  albuterol (PROVENTIL HFA;VENTOLIN HFA) 108 (90 Base) MCG/ACT inhaler Inhale 1-2 puffs into the lungs every 6 (six) hours as needed for wheezing or shortness of breath. 05/01/18   Kinnie Feil, PA-C  amLODipine (NORVASC) 5 MG tablet Take 1 tablet (5 mg total) by mouth daily. 12/07/17   Sela Hilding, MD  asenapine (SAPHRIS) 5 MG SUBL 24 hr tablet Place 5 mg under the tongue 2 (two) times daily.    [provider]  atorvastatin (LIPITOR) 20 MG tablet Take 1 tablet (20 mg total) by mouth daily. 12/07/17   Sela Hilding, MD  Cetirizine HCl (ZYRTEC ALLERGY) 10 MG CAPS Take 1 capsule (10 mg total) by mouth daily. 12/07/17   Sela Hilding, MD  [facility] MELATONIN 3 MG TABS TAKE 1 TABLET (3 MG TOTAL) BY MOUTH AT BEDTIME. 03/23/17   Nicolette Bang, DO  fluticasone [facility]) 50 MCG/ACT nasal spray Place 2 sprays into both nostrils daily. 10/20/16   Nicolette Bang, DO  losartan-hydrochlorothiazide (HYZAAR) 100-12.5 MG tablet Take 1 tablet by mouth daily. 12/07/17   Sela Hilding, MD  nystatin (MYCOSTATIN/NYSTOP) powder Apply topically 3 (three) times daily. 12/07/17   Sela Hilding, MD  Olopatadine HCl 0.2 % SOLN Apply 1 drop to eye daily. 08/10/16   Ward, York Cerise  Pilcher, PA-C  oxymetazoline (AFRIN) 0.05 % nasal spray Place 1 spray into both nostrils 2 (two) times daily. 10/20/16   Nicolette Bang, DO  silver sulfADIAZINE (SILVADENE) 1 % cream Apply 1 application topically daily. 02/23/17   Maczis, Barth Kirks, PA-C  valsartan-hydrochlorothiazide (DIOVAN-HCT) 160-25 MG tablet Take 1 tablet by mouth daily. 01/09/18   Sela Hilding, MD    Family History Family History  Problem Relation Age of Onset  . Hypertension Father   . Cancer Father     Social History  Social History   Tobacco Use  . Smoking status: Former Smoker    Packs/day: 0.25    Years: 20.00    Pack years: 5.00    Types: Cigarettes    Last attempt to quit: 08/02/1998    Years since quitting: 19.7  . Smokeless tobacco: Never Used  Substance Use Topics  . Alcohol use: No  . Drug use: Yes    Frequency: 21.0 times per week    Types: Marijuana    Comment: 3x daily     Allergies   Patient has no known allergies.   Review of Systems Review of Systems  Constitutional: Positive for fever.  HENT: Positive for congestion and rhinorrhea.   Respiratory: Positive for cough.   All other systems reviewed and are negative.    Physical Exam Updated Vital Signs BP (!) 146/98 (BP Location: Right Arm)   Pulse 88   Temp 97.8 F (36.6 C) (Oral)   Resp 18   Ht 5\' 3"  (1.6 m)   Wt 72.6 kg   LMP 07/06/2012   SpO2 98%   BMI 28.34 kg/m   Physical Exam Vitals signs and nursing note reviewed.  Constitutional:      General: She is not in acute distress.    Appearance: She is well-developed.     Comments: NAD. Sound congested.   HENT:     Head: Normocephalic and atraumatic.     Right Ear: External ear normal.     Left Ear: External ear normal.     Nose: Congestion and rhinorrhea present.     Comments: Moderate mucosal edema, erythema, clear rhinorrhea.     Mouth/Throat:     Comments: MMM. Oropharynx and tonsils normal.  Eyes:     General: No scleral icterus.    Conjunctiva/sclera: Conjunctivae normal.  Neck:     Musculoskeletal: Normal range of motion and neck supple.     Comments: Mildly enlarged submandibular lymph nodes, symmetric bilaterally. No asymmetric anterior neck edema.  Cardiovascular:     Rate and Rhythm: Normal rate and regular rhythm.     Heart sounds: Normal heart sounds. No murmur.  Pulmonary:     Effort: Pulmonary effort is normal.     Breath sounds: Normal breath sounds. No wheezing.     Comments: Normal WOB.  Musculoskeletal: Normal range of  motion.        General: No deformity.  Lymphadenopathy:     Cervical: Cervical adenopathy present.  Skin:    General: Skin is warm and dry.     Capillary Refill: Capillary refill takes less than 2 seconds.  Neurological:     Mental Status: She is alert and oriented to person, place, and time.  Psychiatric:        Behavior: Behavior normal.        Thought Content: Thought content normal.        Judgment: Judgment normal.      ED Treatments / Results  Labs (  all labs ordered are listed, but only abnormal results are displayed) Labs Reviewed - No data to display  EKG None  Radiology Dg Chest 2 View  Result Date: 05/01/2018 CLINICAL DATA:  Productive cough, URI, fever, chills since Friday morning. Hx of hypertension, sickle cell trait. Former smoker(2000)(smoker marijuana 3x a day). EXAM: CHEST - 2 VIEW COMPARISON:  01/14/2015 FINDINGS: Cardiac silhouette is normal in size. No mediastinal or hilar masses. No evidence of adenopathy. Lungs are clear.  No pleural effusion or pneumothorax. Stable changes from the anterior cervical spine fusion. Skeletal structures are intact. IMPRESSION: No active cardiopulmonary disease. Electronically Signed   By: Lajean Manes M.D.   On: 05/01/2018 19:26    Procedures Procedures (including critical care time)  Medications Ordered in ED Medications - No data to display   Initial Impression / Assessment and Plan / ED Course  I have reviewed the triage vital signs and the nursing notes.  Pertinent labs & imaging results that were available during my care of the patient were reviewed by me and considered in my medical decision making (see chart for details).        58 y.o. -year-old female with with URI like symptoms  4 days. Known sick contacts. On my exam patient is nontoxic appearing, speaking in full sentences, w/o increased WOB. No fever, tachypnea, tachycardia, hypoxia. Lungs are CTAB. CXR ordered at triage negative.  No significant h/o  immunocompromise. Doubt bacterial bronchitis or pneumonia.  No clinical signs to suggest strep. Given reassuring physical exam, will discharge with symptomatic treatment.  She last smoked marijuana joint PTA.  This is likely worsening cough.  Feel she may benefit from albuterol inhaler.  Strict ED return precautions given. Patient is aware that a viral URI infection may precede pneumonia or worsening illness. Patient is aware of red flag symptoms to monitor for that would warrant return to the ED for further reevaluation. No concern for COVID exposure, recent travel.  Although this could be influenza pt has no indication for tamifly, symptom onset > 72 hours.     Final Clinical Impressions(s) / ED Diagnoses   Final diagnoses:  Viral URI with cough    ED Discharge Orders         Ordered    albuterol (PROVENTIL HFA;VENTOLIN HFA) 108 (90 Base) MCG/ACT inhaler  Every 6 hours PRN     05/01/18 2014            Arlean Hopping 05/01/18 2016    Pattricia Boss, MD 05/05/18 [id number]

## 2018-05-01 NOTE — Telephone Encounter (Signed)
Called patient.  Patient reports 3 days of intermittent chills and worsening shortness of breath.  She is now dyspneic at rest and reports it is hard to breathe when she is talking.  She has a history of hypertension but no history of lung disease.  She also endorses a productive cough and nasal congestion.  Given symptoms I recommended the patient be seen either urgent care in the emergency room today as she is dyspneic at rest.  Reviewed all return precautions.  Patient agreed to go to emergency room today.   Dorris Singh, MD  Family Medicine Teaching Service

## 2018-05-04 ENCOUNTER — Telehealth: Payer: Self-pay

## 2018-05-04 NOTE — Telephone Encounter (Signed)
Pt called nurse line stating she was seen in ED earlier this week and given Flu diagnose. Pt states she was given an inhaler and she is no longer experiencing SOB. Pt now is complaining of a cough and green mucous. Pt stated she is going to pick up some mucinex, however she would like something called into her pharmacy for cough. She can not afford both mucinex and a cough supressant. [facility] advise[facility] [address].

## 2018-05-05 MED ORDER — BENZONATATE 100 MG PO CAPS: 100.0000 mg | ORAL_CAPSULE | Freq: Three times a day (TID) | ORAL | 1 refills | Status: DC | PRN

## 2018-05-05 NOTE — Telephone Encounter (Signed)
I will send tessalon pearls for cough. This will likely be more helpful than mucinex. Please have her call if she is worsening or has additional concerns.

## 2018-05-05 NOTE — Addendum Note (Signed)
Addended by: Glenis Smoker on: 05/05/2018 09:59 AM   Modules accepted: Orders

## 2018-05-05 NOTE — Telephone Encounter (Signed)
Tried to contact pt and inform her of below and the phone stated that my call could not be completed to try again later, I tried twice and got the same message. Donia Yokum Zimmerman Rumple, CMA

## 2018-05-08 NOTE — Telephone Encounter (Signed)
Tried to contact pt. "Call can not be completed at this time. Please hang up and try you call again later." Ottis Stain, CMA

## 2018-05-10 NOTE — Telephone Encounter (Signed)
Tried to contact pt again and message was "Call cannot be completed at this time, please hang up and try your call again later.April Zimmerman Rumple, CMA

## 2018-07-14 ENCOUNTER — Encounter: Payer: Self-pay | Admitting: Family Medicine

## 2018-07-14 ENCOUNTER — Other Ambulatory Visit: Payer: Self-pay

## 2018-07-14 ENCOUNTER — Ambulatory Visit (INDEPENDENT_AMBULATORY_CARE_PROVIDER_SITE_OTHER): Payer: Medicare Other | Admitting: Family Medicine

## 2018-07-14 VITALS — BP 130/72 | HR 80

## 2018-07-14 DIAGNOSIS — J069 Acute upper respiratory infection, unspecified: Secondary | ICD-10-CM

## 2018-07-14 DIAGNOSIS — R682 Dry mouth, unspecified: Secondary | ICD-10-CM | POA: Diagnosis present

## 2018-07-14 DIAGNOSIS — B9789 Other viral agents as the cause of diseases classified elsewhere: Secondary | ICD-10-CM | POA: Diagnosis not present

## 2018-07-14 DIAGNOSIS — Z77120 Contact with and (suspected) exposure to mold (toxic): Secondary | ICD-10-CM | POA: Diagnosis not present

## 2018-07-14 MED ORDER — FLUTICASONE PROPIONATE 50 MCG/ACT NA SUSP: 2.0000 | Freq: Every day | NASAL | 1 refills | Status: DC

## 2018-07-14 NOTE — Assessment & Plan Note (Addendum)
Patient symptoms headache, cough, sore throat and congestive symptoms are likely result recent environmental exposures, from the roof and resulting debris after ceiling collapse.  Patient has been using inhaler at home, though it has not been very helpful.  She is afebrile, does not have any wheezing or increased work of breathing on exam making infectious cause less likely.  No crackles on exam.  Her shortness of breath can also be attributed to her 3 times daily marijuana smoking and remote history of cigarette smoking.  Patient thought coughing may have been exacerbated by the recent switch in her ARB medication, though this is unlikely as she switched ARBs 3 months ago.  Patient advised to limit closure as much as possible though, this will likely be difficult.  Patient reports that she will try to stay in a hotel.  We will continue to monitor patient for worsening symptoms, encourage cessation of marijuana smoking, and continue her allergy medications, including cetirizine and Flonase.  Refill Flonase, 1 puff twice daily  Daily Zyrtec
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
## 2018-07-14 NOTE — Patient Instructions (Addendum)
Dear Debbie Clarke,   It was very nice to see you! Thank you for taking your time to come in to be seen. Today, we discussed the following:   Congestion, sore threat, headache   Use the Flonase twice daily and zyrtec once daily. I have sent a refill of flonase to your pharmacy.   The best thing you can do to prevent these symptoms is to please avoid exposure to the  Particles & dust from your leaking roof and insulation, as this is likely what is causing your symptoms.   If you develop fever, chills, nausea, vomiting, difficulty breathing, please call the office or go to the nearest emergency department of urgent care center.   Be well,   Dr. Zettie Cooley [facility] [phone]   Sign up for MyChart for instant access to your health profile, labs, orders, upcoming appointments or to contact your provider with questions.

## 2018-07-14 NOTE — Assessment & Plan Note (Signed)
Patient's dry mouth is likely due to nasal congestion causing her to breathe in and out of her mouth.  She also takes Zyrtec daily, but this is less likely to cause acute dry mouth.  Additionally, patient uses THC 3 times daily which can also cause dry mouth.  Would affect improvement with improvement of congestion

## 2018-08-01 ENCOUNTER — Other Ambulatory Visit: Payer: Self-pay

## 2018-08-01 ENCOUNTER — Encounter: Payer: Self-pay | Admitting: Family Medicine

## 2018-08-01 ENCOUNTER — Telehealth (INDEPENDENT_AMBULATORY_CARE_PROVIDER_SITE_OTHER): Payer: Medicare Other | Admitting: Family Medicine

## 2018-08-01 DIAGNOSIS — R0981 Nasal congestion: Secondary | ICD-10-CM | POA: Insufficient documentation

## 2018-08-01 DIAGNOSIS — G44209 Tension-type headache, unspecified, not intractable: Secondary | ICD-10-CM | POA: Insufficient documentation

## 2018-08-01 NOTE — Assessment & Plan Note (Signed)
Per patient's reports, patient's headache is consistent with tension headache.  She reports that she has been very stressed with her living situation and about her apartment.  She also reports having had migraines in the past and denies that this is similar to those episodes.  Patient does not like Tylenol as it hurts her stomach.  Patient encouraged to take drink plenty of water and can also take as needed ibuprofen for up to 7 days.  She also reports that Narcissa powder helps with her headaches.  Patient can continue to use Goody powder but should limit use to a couple times per week as it increases her risk of bleeding.  As this issue was acute, will manage conservatively with follow-up should patient continue to have headaches in a few weeks.  Headaches can also be contributing to her blurred vision.  Also on the differential for blurred vision includes diabetes, however recent labs show that glucose has been within normal limits on last few CMP's.  There is no record of A1c.  Hydration with water  Ibuprofen as needed, not to be used for over 7 days (creatinine within normal limits)  Goody powder PRN - limit to a couple doses per week. Return if feeling lightheaded, dizzy or having black stool.
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
## 2018-08-08 ENCOUNTER — Other Ambulatory Visit: Payer: Self-pay | Admitting: *Deleted

## 2018-08-08 DIAGNOSIS — I10 Essential (primary) hypertension: Secondary | ICD-10-CM

## 2018-08-09 ENCOUNTER — Other Ambulatory Visit: Payer: Self-pay | Admitting: Family Medicine

## 2018-08-09 DIAGNOSIS — J069 Acute upper respiratory infection, unspecified: Secondary | ICD-10-CM

## 2018-08-09 MED ORDER — VALSARTAN-HYDROCHLOROTHIAZIDE 160-25 MG PO TABS: 1.0000 | ORAL_TABLET | Freq: Every day | ORAL | 0 refills | Status: DC

## 2018-09-19 ENCOUNTER — Encounter: Payer: Self-pay | Admitting: Family Medicine

## 2018-09-19 ENCOUNTER — Other Ambulatory Visit: Payer: Self-pay

## 2018-09-19 ENCOUNTER — Ambulatory Visit (INDEPENDENT_AMBULATORY_CARE_PROVIDER_SITE_OTHER): Payer: Medicare Other | Admitting: Family Medicine

## 2018-09-19 VITALS — BP 132/84 | HR 68 | Wt 173.4 lb

## 2018-09-19 DIAGNOSIS — W57XXXA Bitten or stung by nonvenomous insect and other nonvenomous arthropods, initial encounter: Secondary | ICD-10-CM | POA: Insufficient documentation

## 2018-09-19 DIAGNOSIS — D509 Iron deficiency anemia, unspecified: Secondary | ICD-10-CM | POA: Diagnosis not present

## 2018-09-19 DIAGNOSIS — Z77120 Contact with and (suspected) exposure to mold (toxic): Secondary | ICD-10-CM

## 2018-09-19 DIAGNOSIS — R202 Paresthesia of skin: Secondary | ICD-10-CM | POA: Insufficient documentation

## 2018-09-19 NOTE — Assessment & Plan Note (Signed)
Hemoglobin in November was 10.4.  Microcytic with MCV in the 70s.  Subsequent ferritin was normal.  Patient has been given iron by the gastroenterologist who performed a colonoscopy, but has yet to perform an EGD due to coronavirus.  Based on her normal ferritin is unlikely patient has iron deficiency anemia.  She does have a history of sickle cell trait.  Will wait for EGD results for possible bleed before further work-up of anemia. - CBC - B12 level

## 2018-09-19 NOTE — Progress Notes (Signed)
[facility] Phone: [phone]     ARORA COAKLEY - 58 y.o. female MRN [id number]  Date of birth: 01/03/1961  Subjective:   cc: Pain in hands and feet, rash, breathing issues  HPI:  Respiratory complaints: Patient states she has been having respiratory issues with headache for the past year and a half.  Earlier this year she was seen in our office for respiratory complaints were discovered that she recently had her bedroom roof cave in from a leak, with possible mold and mildew exposure.  In June she was not living there and was stating "so for the sofa "on relatives house, but in July she moved back and has been staying in the downstairs living room.  Patient also complains of frequent headaches during this time.  Patient has been taking her allergy medicine and using her inhaler, most recently was yesterday.  Pain in hands and legs -patient states she has been recently getting intermittent numbness and tingling sensations in her hands and feet.  These last 8 to 10 minutes at a time and do not happen every day, but recently have been happening more often.  Yesterday she had 4 episodes.  She states it has been worse since she moved back into her old house on July 9.  There is no associated triggers and nothing she does relieves the pain.  The pain goes from knees to where the "foot meets the toes" and in her hands it is worse in the right middle finger.  She states sometimes she loses her balance and the other day noticed herself "drifting to the left".  Rash: Patient states this morning she woke up and started itching on her legs and torso and noticed that she had multiple circular areas of redness.  She does not member being bit by anything during the night.  She states she does not have bedbugs.  She did not notice any spiders or ticks.  She is still sleeping in the same bed she had been for the previous month.  No changes to sheets or blankets.  Anemia : Patient states  that she has been recently worked up for anemia by gastroenterologist.  She still has to do a EGD sometime in the next month.  She has been on iron supplementation during this time.   ROS: See HPI for pertinent positives and negatives  Past Medical History  Family history reviewed for today's visit. No changes.  Social history- patient is a non-smoker  Objective:   BP 132/84   Pulse 68   Wt 173 lb 6.4 oz (78.7 kg)   LMP 07/06/2012   SpO2 94%   BMI 30.72 kg/m  Gen: NAD, alert and oriented, cooperative with exam HEENT: NCAT, EOMI, MMM CV: normal rate, regular rhythm. No murmurs, no rubs.  Resp: LCTAB, no wheezes, crackles. normal work of breathing GI: nontender to palpation, BS present, no guarding or organomegaly Neuro: Sensation pain and light touch intact in the feet bilaterally Skin: Several areas of circular erythema with a punctate lesion in the center.  See pictures below Psych: Appropriate behavior          Assessment/Plan:   Paresthesia of upper and lower extremities of both sides Patient describes worsening of an intermittent paresthesia of both hands and feet lasting approximately 5 to 10 minutes.  patient does not have diabetes.  On exam she had intact sensation in her hands and feet.  strength was equal bilaterally.  Uncertain what is caused  her to develop intermittent paresthesias of all 4 extremities.  Will get TSH, B12, CMP for further evaluation.  Could be a somatic component given the description of her symptoms.  Anemia Hemoglobin in November was 10.4.  Microcytic with MCV in the 70s.  Subsequent ferritin was normal.  Patient has been given iron by the gastroenterologist who performed a colonoscopy, but has yet to perform an EGD due to coronavirus.  Based on her normal ferritin is unlikely patient has iron deficiency anemia.  She does have a history of sickle cell trait.  Will wait for EGD results for possible bleed before further work-up of anemia. - CBC  - B12 level  Mold exposure After spending June somewhere else, patient has moved back into her house with mold in the bedroom.  She is not sleeping in the bedroom though, she is sleeping downstairs.  Patient states she was moving out soon.  We will follow-up with patient in 1 month to reevaluate after she has had a chance to move out.  Physical exam did not show any respiratory distress or wheezing.  May need referral to allergist.  For now patient should continue on her current medications.   Orders Placed This Encounter  Procedures  . CBC  . TSH  . Vitamin B12  . Comprehensive metabolic panel    Order Specific Question:   Has the patient fasted?    Answer:   No    Clemetine Marker, MD PGY-2 [facility]

## 2018-09-19 NOTE — Assessment & Plan Note (Signed)
After spending June somewhere else, patient has moved back into her house with mold in the bedroom.  She is not sleeping in the bedroom though, she is sleeping downstairs.  Patient states she was moving out soon.  We will follow-up with patient in 1 month to reevaluate after she has had a chance to move out.  Physical exam did not show any respiratory distress or wheezing.  May need referral to allergist.  For now patient should continue on her current medications.

## 2018-09-19 NOTE — Assessment & Plan Note (Signed)
Several bites on the abdomen and the thighs that appeared overnight.  They are pruritic.  Patient denies having bedbugs. -Gave patient bedbug traps to put near her bed - We will follow-up with the patient in 1 month.

## 2018-09-19 NOTE — Assessment & Plan Note (Signed)
Patient describes worsening of an intermittent paresthesia of both hands and feet lasting approximately 5 to 10 minutes.  patient does not have diabetes.  On exam she had intact sensation in her hands and feet.  strength was equal bilaterally.  Uncertain what is caused her to develop intermittent paresthesias of all 4 extremities.  Will get TSH, B12, CMP for further evaluation.  Could be a somatic component given the description of her symptoms.

## 2018-09-19 NOTE — Patient Instructions (Addendum)
It is difficult to say what is causing your pins-and-needles sensation in your hands and feet at this time.  I have ordered some blood work to help me rule out some different causes.  I would like to see you back in 1 month to further evaluate this.  I think the rash you are experiencing is most likely from bedbugs, although could be spider bites.  He definitely appear to be bites of some kind.  Please use this bedbug trap I have given you to put around the bed.  Have a great day,  Clemetine Marker, MD

## 2018-09-20 LAB — COMPREHENSIVE METABOLIC PANEL
ALT: 15 IU/L (ref 0–32)
AST: 19 IU/L (ref 0–40)
Albumin/Globulin Ratio: 1.5 (ref 1.2–2.2)
Albumin: 4.3 g/dL (ref 3.8–4.9)
Alkaline Phosphatase: 97 IU/L (ref 39–117)
BUN/Creatinine Ratio: 13 (ref 9–23)
BUN: 10 mg/dL (ref 6–24)
Bilirubin Total: 0.3 mg/dL (ref 0.0–1.2)
CO2: 28 mmol/L (ref 20–29)
Calcium: 9.6 mg/dL (ref 8.7–10.2)
Chloride: 98 mmol/L (ref 96–106)
Creatinine, Ser: 0.75 mg/dL (ref 0.57–1.00)
GFR calc Af Amer: 102 mL/min/{1.73_m2} (ref >59)
GFR calc non Af Amer: 89 mL/min/{1.73_m2} (ref >59)
Globulin, Total: 2.9 g/dL (ref 1.5–4.5)
Glucose: 94 mg/dL (ref 65–99)
Potassium: 3.5 mmol/L (ref 3.5–5.2)
Sodium: 143 mmol/L (ref 134–144)
Total Protein: 7.2 g/dL (ref 6.0–8.5)

## 2018-09-20 LAB — CBC
Hematocrit: 35 % (ref 34.0–46.6)
Hemoglobin: 11.3 g/dL (ref 11.1–15.9)
MCH: 26.3 pg — ABNORMAL LOW (ref 26.6–33.0)
MCHC: 32.3 g/dL (ref 31.5–35.7)
MCV: 81 fL (ref 79–97)
Platelets: 277 10*3/uL (ref 150–450)
RBC: 4.3 x10E6/uL (ref 3.77–5.28)
RDW: 15.7 % — ABNORMAL HIGH (ref 11.7–15.4)
WBC: 3.9 10*3/uL (ref 3.4–10.8)

## 2018-09-20 LAB — VITAMIN B12: Vitamin B-12: 392 pg/mL (ref 232–1245)

## 2018-09-20 LAB — TSH: TSH: 0.983 u[IU]/mL (ref 0.450–4.500)

## 2018-09-26 ENCOUNTER — Encounter: Payer: Self-pay | Admitting: Family Medicine

## 2018-09-29 DIAGNOSIS — Z1159 Encounter for screening for other viral diseases: Secondary | ICD-10-CM | POA: Diagnosis not present

## 2018-10-03 DIAGNOSIS — D509 Iron deficiency anemia, unspecified: Secondary | ICD-10-CM | POA: Diagnosis not present

## 2018-10-03 DIAGNOSIS — K449 Diaphragmatic hernia without obstruction or gangrene: Secondary | ICD-10-CM | POA: Diagnosis not present

## 2018-10-06 DIAGNOSIS — D509 Iron deficiency anemia, unspecified: Secondary | ICD-10-CM | POA: Diagnosis not present

## 2018-10-20 ENCOUNTER — Other Ambulatory Visit: Payer: Self-pay

## 2018-10-20 ENCOUNTER — Encounter: Payer: Self-pay | Admitting: Family Medicine

## 2018-10-20 ENCOUNTER — Ambulatory Visit (INDEPENDENT_AMBULATORY_CARE_PROVIDER_SITE_OTHER): Payer: Medicare Other | Admitting: Family Medicine

## 2018-10-20 DIAGNOSIS — R202 Paresthesia of skin: Secondary | ICD-10-CM

## 2018-10-20 MED ORDER — DICLOFENAC SODIUM 1 % TD GEL: 2.0000 g | Freq: Four times a day (QID) | TRANSDERMAL | 2 refills | Status: DC

## 2018-10-20 NOTE — Progress Notes (Signed)
   [facility] Phone: [phone]     Debbie Clarke - 58 y.o. female MRN [id number]  Date of birth: 09-17-1960  Subjective:   cc: f/u respiratory complaints and paresthesia  HPI:  Bites:  Patient moved; lesions resolved.  She thinks it was a spider bite. She moved out of the house she was in bc of the mold issues.    Respiratory symptoms: improved after moving out of old house   Paresthesias: She still has tingling in her legs and hands.  It is worse in the morning, but Once she gets moving the pain improves. The tingling is both legs, but the pain is worse in the left side. She has numbness as well in the legs, which takes 'forever' to wake itself up.  When her hands hurt they turn a white color and blueish color.  The tingling occurs one to three times a day, every day.  It has been occurring since her spinal surgery in 2014.   ROS: See HPI for pertinent positives and negatives  Past Medical History  Family history reviewed for today's visit. No changes.   Health Maintenance:  - Health Maintenance Due  Topic Date Due  . INFLUENZA VACCINE  09/16/2018    -  reports that she quit smoking about 20 years ago. Her smoking use included cigarettes. She has a 5.00 pack-year smoking history. She has never used smokeless tobacco.  Objective:   BP 104/62   Pulse 94   Wt 172 lb 6.4 oz (78.2 kg)   LMP 07/06/2012   SpO2 96%   BMI 30.54 kg/m  Gen: NAD, alert and oriented, cooperative with exam CV: normal rate, regular rhythm. No murmurs, no rubs.  Resp: LCTAB, no wheezes, crackles. normal work of breathing GI: nontender to palpation, BS present, no guarding or organomegaly Msk: No edema, warm, normal tone, moves UE/LE spontaneously  No swelling of the interphalangeal joints.   Neuro: CN II-XII grossly intact. no gross deficits. Sensation to pain present in feet bilaterally. Skin: No rashes, no lesions Psych: Appropriate behavior  Assessment/Plan:    Paresthesia of upper and lower extremities of both sides Patient's reports bilateral upper and lower extremity paresthesia/anesthesia occurring 1-3 times a day for past several years consistent with neuropathy of unknown origin. Patient is not diabetic. She also describes symptoms of color change in her fingertips associated with the pain, which sounds more like Rayndauds phenomenon than a neuropathy, although there does not appear to be any association with temperature.  No other signs of autoimmune disease, but does have some arthralgia in her hands.  Uncertain if this is neuropathy or raynauds (vascular issue).  Will have patient keep detailed log of occurrences and associated findings and have her follow up in one month. If it seems more neuropathic will need to work up likely causes of non diabetic axonal length dependent symmetric polyneuropathy including: alcohol, B12, syphilis, HIV, CIDP, vasculitis, paraneoplastic, amyloidosis.   - apply voltaren gel BID - keep log of occurrences of neuropathy.    Meds ordered this encounter  Medications  . diclofenac sodium (VOLTAREN) 1 % GEL    Sig: Apply 2 g topically 4 (four) times daily. On affected areas    Dispense:  150 g    Refill:  2     Clemetine Marker, MD PGY-2 [facility]

## 2018-10-20 NOTE — Patient Instructions (Signed)
Please come back in one months to discuss your neuropathy.  In the meantime, please keep a notebook of all the times you get these symtpoms, including where you are, what you're doing and what the temperature is.    Have a great day,   Clemetine Marker, MD

## 2018-10-22 NOTE — Assessment & Plan Note (Signed)
Patient's reports bilateral upper and lower extremity paresthesia/anesthesia occurring 1-3 times a day for past several years consistent with neuropathy of unknown origin. Patient is not diabetic. She also describes symptoms of color change in her fingertips associated with the pain, which sounds more like Rayndauds phenomenon than a neuropathy, although there does not appear to be any association with temperature.  No other signs of autoimmune disease, but does have some arthralgia in her hands.  Uncertain if this is neuropathy or raynauds (vascular issue).  Will have patient keep detailed log of occurrences and associated findings and have her follow up in one month. If it seems more neuropathic will need to work up likely causes of non diabetic axonal length dependent symmetric polyneuropathy including: alcohol, B12, syphilis, HIV, CIDP, vasculitis, paraneoplastic, amyloidosis.   - apply voltaren gel BID - keep log of occurrences of neuropathy.

## 2018-11-06 ENCOUNTER — Other Ambulatory Visit: Payer: Self-pay

## 2018-11-06 DIAGNOSIS — I10 Essential (primary) hypertension: Secondary | ICD-10-CM

## 2018-11-07 ENCOUNTER — Other Ambulatory Visit: Payer: Self-pay

## 2018-11-07 DIAGNOSIS — I10 Essential (primary) hypertension: Secondary | ICD-10-CM

## 2018-11-07 MED ORDER — VALSARTAN-HYDROCHLOROTHIAZIDE 160-25 MG PO TABS: 1.0000 | ORAL_TABLET | Freq: Every day | ORAL | 0 refills | Status: DC

## 2018-11-07 MED ORDER — AMLODIPINE BESYLATE 5 MG PO TABS: 5.0000 mg | ORAL_TABLET | Freq: Every day | ORAL | 2 refills | Status: DC

## 2018-11-28 DIAGNOSIS — H2513 Age-related nuclear cataract, bilateral: Secondary | ICD-10-CM | POA: Diagnosis not present

## 2018-11-28 DIAGNOSIS — H35033 Hypertensive retinopathy, bilateral: Secondary | ICD-10-CM | POA: Diagnosis not present

## 2018-11-28 DIAGNOSIS — H25013 Cortical age-related cataract, bilateral: Secondary | ICD-10-CM | POA: Diagnosis not present

## 2018-11-28 DIAGNOSIS — H04123 Dry eye syndrome of bilateral lacrimal glands: Secondary | ICD-10-CM | POA: Diagnosis not present

## 2019-01-01 ENCOUNTER — Telehealth: Payer: Self-pay | Admitting: *Deleted

## 2019-01-01 NOTE — Telephone Encounter (Signed)
LM for patient and asked her to call back if she was interested in having her flu shot this year.  Magon Croson,CMA

## 2019-03-01 ENCOUNTER — Encounter: Payer: Self-pay | Admitting: Family Medicine

## 2019-03-01 ENCOUNTER — Ambulatory Visit (INDEPENDENT_AMBULATORY_CARE_PROVIDER_SITE_OTHER): Payer: Medicare Other | Admitting: Family Medicine

## 2019-03-01 ENCOUNTER — Other Ambulatory Visit: Payer: Self-pay

## 2019-03-01 ENCOUNTER — Ambulatory Visit
Admission: RE | Admit: 2019-03-01 | Discharge: 2019-03-01 | Disposition: A | Discharge status: Home / Self Care | Payer: Medicare Other | Source: Ambulatory Visit | Attending: Family Medicine | Admitting: Family Medicine

## 2019-03-01 VITALS — BP 98/60 | HR 71 | Wt 176.6 lb

## 2019-03-01 DIAGNOSIS — T148XXA Other injury of unspecified body region, initial encounter: Secondary | ICD-10-CM

## 2019-03-01 DIAGNOSIS — I10 Essential (primary) hypertension: Secondary | ICD-10-CM | POA: Diagnosis not present

## 2019-03-01 DIAGNOSIS — M25552 Pain in left hip: Secondary | ICD-10-CM | POA: Diagnosis not present

## 2019-03-01 MED ORDER — CYCLOBENZAPRINE HCL 5 MG PO TABS: 5.0000 mg | ORAL_TABLET | Freq: Every day | ORAL | 0 refills | Status: DC

## 2019-03-01 MED ORDER — VALSARTAN-HYDROCHLOROTHIAZIDE 160-25 MG PO TABS: 1.0000 | ORAL_TABLET | Freq: Every day | ORAL | 0 refills | Status: DC

## 2019-03-01 MED ORDER — IBUPROFEN 400 MG PO TABS: 400.0000 mg | ORAL_TABLET | Freq: Three times a day (TID) | ORAL | 0 refills | Status: DC | PRN

## 2019-03-01 MED ORDER — AMLODIPINE BESYLATE 5 MG PO TABS: 5.0000 mg | ORAL_TABLET | Freq: Every day | ORAL | 2 refills | Status: DC

## 2019-03-01 MED ORDER — ATORVASTATIN CALCIUM 20 MG PO TABS: 20.0000 mg | ORAL_TABLET | Freq: Every day | ORAL | 3 refills | Status: DC

## 2019-03-01 NOTE — Progress Notes (Signed)
  Patient Name: [name] Date of Birth: Jun 17, 1960 Date of Visit: 03/01/19 PCP: Benay Pike, MD  Chief Complaint: Left side leg pain  Subjective: Debbie Clarke is a pleasant 58 y.o. with medical history significant for hypertension, GERD, fibromyalgia, arthritis presenting today for left groin and hip pain.   Left leg pain Patient reports pain ongoing for about a month.  Was out walking during Christmas and felt a pull in her left groin 20 minutes into the walk.  Was able to complete the hour walk.  Went home took a hot bath with some herbal medication which helped a little.  Feels that pain is been getting worse since that time.  Has not taken any Tylenol or ibuprofen for pain.  She reports pain is constant in her groin area radiating to the hip.  Able to get around without walker or cane but does have significant pain.  ROS: Per HPI.   I have reviewed the patient's medical, surgical, family, and social history as appropriate.  Vitals:   03/01/19 0926  BP: 98/60  Pulse: 71  SpO2: 98%    Left hip and groin: Pain elicited on adduction and palpation over left hip.  No crepitus noted.  No tenderness in left groin area.  No lymphadenopathy.  No erythema or no swelling appreciated. SLR negative, gait normal.   Muscle strain Given the patient reports pulling sensation, able to complete 1 hour walk and some relief with hot bath most likely related to muscle strain.  Also considered in differential fracture but given no recent trauma less likely. -We will obtain left hip x-ray -Refer physiotherapy -Ibuprofen 400 mg every 8 as needed -Tylenol 500 mg every 6 as needed -Flexeril 5 mg nightly as needed -Follow-up with PCP as needed  Carollee Leitz, MD   [facility] Medicine Teaching Service

## 2019-03-01 NOTE — Patient Instructions (Addendum)
It was a pleasure meeting you.  You were seen for left hip pain.   I have ordered an xray of your hip and will call you the results are abnormal.  I have ordered ibuprofen 400 mg to take every 8 hours for pain. I have also ordered Flexeril 5 mg to take at night to help with muscle spasms.  I will also put in a referral for physiotherapy   Follow-up with your PCP as needed  Have a great day and stay safe Carollee Leitz, MD   Muscle Strain A muscle strain is an injury that occurs when a muscle is stretched beyond its normal length. Usually, a small number of muscle fibers are torn when this happens. There are three types of muscle strains. First-degree strains have the least amount of muscle fiber tearing and the least amount of pain. Second-degree and third-degree strains have more tearing and pain. Usually, recovery from muscle strain takes 1-2 weeks. Complete healing normally takes 5-6 weeks. What are the causes? This condition is caused when a sudden, violent force is placed on a muscle and stretches it too far. This may occur with a fall, lifting, or sports. What increases the risk? This condition is more likely to develop in athletes and people who are physically active. What are the signs or symptoms? Symptoms of this condition include:  Pain.  Bruising.  Swelling.  Trouble using the muscle. How is this diagnosed? This condition is diagnosed based on a physical exam and your medical history. Tests may also be done, including an X-ray, ultrasound, or MRI. How is this treated? This condition is initially treated with PRICE therapy. This therapy involves:  Protecting the muscle from being injured again.  Resting the injured muscle.  Icing the injured muscle.  Applying pressure (compression) to the injured muscle. This may be done with a splint or elastic bandage.  Raising (elevating) the injured muscle. Your health care provider may also recommend medicine for  pain. Follow these instructions at home: If you have a splint:  Wear the splint as told by your health care provider. Remove it only as told by your health care provider.  Loosen the splint if your fingers or toes tingle, become numb, or turn cold and blue.  Keep the splint clean.  If the splint is not waterproof: ? Do not let it get wet. ? Cover it with a watertight covering when you take a bath or a shower. Managing pain, stiffness, and swelling   If directed, put ice on the injured area. ? If you have a removable splint, remove it as told by your health care provider. ? Put ice in a plastic bag. ? Place a towel between your skin and the bag. ? Leave the ice on for 20 minutes, 2-3 times a day.  Move your fingers or toes often to avoid stiffness and to lessen swelling.  Raise (elevate) the injured area above the level of your heart while you are sitting or lying down.  Wear an elastic bandage as told by your health care provider. Make sure that it is not too tight. General instructions  Take over-the-counter and prescription medicines only as told by your health care provider.  Restrict your activity and rest the injured muscle as told by your health care provider. Gentle movements may be allowed.  If physical therapy was prescribed, do exercises as told by your health care provider.  Do not put pressure on any part of the splint until it is  fully hardened. This may take several hours.  Do not use any products that contain nicotine or tobacco, such as cigarettes and e-cigarettes. These can delay bone healing. If you need help quitting, ask your health care provider.  Ask your health care provider when it is safe to drive if you have a splint.  Keep all follow-up visits as told by your health care provider. This is important. How is this prevented?  Warm up before exercising. This helps to prevent future muscle strains. Contact a health care provider if:  You have more  pain or swelling in the injured area. Get help right away if:  You have numbness or tingling or lose a lot of strength in the injured area. Summary  A muscle strain is an injury that occurs when a muscle is stretched beyond its normal length.  This condition is caused when a sudden, violent force is placed on a muscle and stretches it too far.  This condition is initially treated with PRICE therapy, which involves protecting, resting, icing, compressing, and elevating.  Gentle movements may be allowed. If physical therapy was prescribed, do exercises as told by your health care provider. This information is not intended to replace advice given to you by your health care provider. Make sure you discuss any questions you have with your health care provider. Document Revised: 01/14/2017 Document Reviewed: 03/10/2016 Elsevier Patient Education  2020 [employer].

## 2019-03-04 DIAGNOSIS — T148XXA Other injury of unspecified body region, initial encounter: Secondary | ICD-10-CM | POA: Insufficient documentation

## 2019-03-04 NOTE — Assessment & Plan Note (Signed)
Given the patient reports pulling sensation, able to complete 1 hour walk and some relief with hot bath most likely related to muscle strain.  Also considered in differential fracture but given no recent trauma less likely. -We will obtain left hip x-ray -Refer physiotherapy -Ibuprofen 400 mg every 8 as needed -Tylenol 500 mg every 6 as needed -Flexeril 5 mg nightly as needed -Follow-up with PCP as needed

## 2019-05-01 ENCOUNTER — Other Ambulatory Visit: Payer: Self-pay | Admitting: Family Medicine

## 2019-05-01 DIAGNOSIS — Z1231 Encounter for screening mammogram for malignant neoplasm of breast: Secondary | ICD-10-CM

## 2019-05-12 ENCOUNTER — Ambulatory Visit: Payer: Medicare Other | Attending: Internal Medicine

## 2019-05-12 DIAGNOSIS — Z23 Encounter for immunization: Secondary | ICD-10-CM

## 2019-05-12 NOTE — Progress Notes (Signed)
   Covid-19 Vaccination Clinic  Name:  Debbie Clarke    MRN: [id number] DOB: 09-12-1960  05/12/2019  [name] was observed post Covid-19 immunization for 15 minutes without incident. She was provided with Vaccine Information Sheet and instruction to access the V-Safe system.   [name] was instructed to call 911 with any severe reactions post vaccine: Marland Kitchen Difficulty breathing  . Swelling of face and throat  . A fast heartbeat  . A bad rash all over body  . Dizziness and weakness   Immunizations Administered    Name Date Dose VIS Date Route   Pfizer COVID-19 Vaccine 05/12/2019 12:25 PM 0.3 mL 01/26/2019 Intramuscular   Manufacturer: [place]   Lot: [id number]   [place]: [id number]

## 2019-05-22 ENCOUNTER — Other Ambulatory Visit: Payer: Self-pay

## 2019-05-22 ENCOUNTER — Ambulatory Visit
Admission: RE | Admit: 2019-05-22 | Discharge: 2019-05-22 | Disposition: A | Discharge status: Home / Self Care | Payer: Medicare Other | Source: Ambulatory Visit | Attending: Family Medicine | Admitting: Family Medicine

## 2019-05-22 DIAGNOSIS — Z1231 Encounter for screening mammogram for malignant neoplasm of breast: Secondary | ICD-10-CM | POA: Diagnosis not present

## 2019-05-27 ENCOUNTER — Other Ambulatory Visit: Payer: Self-pay | Admitting: Family Medicine

## 2019-05-27 DIAGNOSIS — I10 Essential (primary) hypertension: Secondary | ICD-10-CM

## 2019-06-05 ENCOUNTER — Ambulatory Visit: Payer: Medicare Other | Attending: Internal Medicine

## 2019-06-05 ENCOUNTER — Ambulatory Visit: Payer: Medicare Other

## 2019-06-05 DIAGNOSIS — Z23 Encounter for immunization: Secondary | ICD-10-CM

## 2019-06-05 NOTE — Progress Notes (Signed)
   Covid-19 Vaccination Clinic  Name:  Debbie Clarke    MRN: [id number] DOB: 1960-07-17  06/05/2019  [name] was observed post Covid-19 immunization for 15 minutes without incident. She was provided with Vaccine Information Sheet and instruction to access the V-Safe system.   [name] was instructed to call 911 with any severe reactions post vaccine: Marland Kitchen Difficulty breathing  . Swelling of face and throat  . A fast heartbeat  . A bad rash all over body  . Dizziness and weakness   Immunizations Administered    Name Date Dose VIS Date Route   Pfizer COVID-19 Vaccine 06/05/2019  3:59 PM 0.3 mL 04/11/2018 Intramuscular   Manufacturer: [place]   Lot: [id number]   [place]: [id number]

## 2019-06-25 ENCOUNTER — Other Ambulatory Visit: Payer: Self-pay

## 2019-06-25 ENCOUNTER — Other Ambulatory Visit: Payer: Self-pay | Admitting: Family Medicine

## 2019-06-25 DIAGNOSIS — I10 Essential (primary) hypertension: Secondary | ICD-10-CM

## 2019-06-25 NOTE — Telephone Encounter (Signed)
Pt understood and she said they automatically sent this.Daundre Biel Zimmerman Rumple, CMA

## 2019-06-25 NOTE — Telephone Encounter (Signed)
Pt was prescribed this 3 months ago for MSK injury.  Should not be a long term medication. Please call and if she is still having pain, please ask her to schedule a follow up appointment.

## 2019-06-26 MED ORDER — ATORVASTATIN CALCIUM 20 MG PO TABS: 20.0000 mg | ORAL_TABLET | Freq: Every day | ORAL | 3 refills | Status: DC

## 2019-06-26 MED ORDER — VALSARTAN-HYDROCHLOROTHIAZIDE 160-25 MG PO TABS: 1.0000 | ORAL_TABLET | Freq: Every day | ORAL | 0 refills | Status: DC

## 2019-07-17 ENCOUNTER — Ambulatory Visit (INDEPENDENT_AMBULATORY_CARE_PROVIDER_SITE_OTHER): Payer: Medicare Other

## 2019-07-17 ENCOUNTER — Other Ambulatory Visit: Payer: Self-pay

## 2019-07-17 ENCOUNTER — Ambulatory Visit (INDEPENDENT_AMBULATORY_CARE_PROVIDER_SITE_OTHER): Payer: Medicare Other | Admitting: Orthopaedic Surgery

## 2019-07-17 VITALS — BP 102/62 | HR 64 | Ht 63.0 in | Wt 170.0 lb

## 2019-07-17 DIAGNOSIS — M25559 Pain in unspecified hip: Secondary | ICD-10-CM

## 2019-07-17 DIAGNOSIS — M47816 Spondylosis without myelopathy or radiculopathy, lumbar region: Secondary | ICD-10-CM | POA: Diagnosis not present

## 2019-07-17 MED ORDER — DICLOFENAC SODIUM 75 MG PO TBEC: 75.0000 mg | DELAYED_RELEASE_TABLET | Freq: Two times a day (BID) | ORAL | 1 refills | Status: DC

## 2019-07-17 NOTE — Progress Notes (Signed)
Office Visit Note   Patient: Debbie Clarke           Date of Birth: [birth year]           MRN: [id number] Visit Date: 07/17/2019              Requested by: Benay Pike, MD [address],  [place] [address] PCP: Benay Pike, MD   Assessment & Plan: Visit Diagnoses:  1. Hip pain   2. Facet degeneration of lumbar region     Plan: Previous lumbar MRI 2015 showed some lumbar facet arthropathy left L5-S1 otherwise unremarkable.  We will place her on some Voltaren 75 mg p.o. twice daily.  She will work on a walking program states she is already lost 25 pounds.  She can gradually very slowly work her way up to 2 miles daily continue to work on weight loss core strengthening and smoking sensation.  She can return to see me on a as needed basis.  Follow-Up Instructions: Return if symptoms worsen or fail to improve.   Orders:  Orders Placed This Encounter  Procedures  . XR HIPS BILAT W OR W/O PELVIS 2V   Meds ordered this encounter  Medications  . diclofenac (VOLTAREN) 75 MG EC tablet    Sig: Take 1 tablet (75 mg total) by mouth 2 (two) times daily.    Dispense:  60 tablet    Refill:  1      Procedures: No procedures performed   Clinical Data: No additional findings.   Subjective: Chief Complaint  Patient presents with  . Left Hip - Pain  . Right Hip - Pain    HPI 59 year old female seen with left hip pain worse than right pain.  She states she has had some difficulty walking it primarily bothers her at night when she goes to sleep.  She states the ceiling in her bedroom fell so she has been sleeping on a couch.  She had x-rays done in April which showed tiny osteophytes on the femur with out significant joint space narrowing.  This was apparently nonweightbearing film.  Patient denies fever or chills.  She has been diagnosed with spinal stenosis of the cervical region.  She has bipolar disorder.  Patient self medicates with THC which she states  helps.  Review of Systems 14 point systems noncontributory to HPI other than as mentioned above.   Objective: Vital Signs: BP 102/62   Pulse 64   Ht 5\' 3"  (1.6 m)   Wt 170 lb (77.1 kg)   LMP 07/06/2012   BMI 30.11 kg/m   Physical Exam Constitutional:      Appearance: She is well-developed.  HENT:     Head: Normocephalic.     Right Ear: External ear normal.     Left Ear: External ear normal.  Eyes:     Pupils: Pupils are equal, round, and reactive to light.  Neck:     Thyroid: No thyromegaly.     Trachea: No tracheal deviation.  Cardiovascular:     Rate and Rhythm: Normal rate.  Pulmonary:     Effort: Pulmonary effort is normal.  Abdominal:     Palpations: Abdomen is soft.  Skin:    General: Skin is warm and dry.  Neurological:     Mental Status: She is alert and oriented to person, place, and time.  Psychiatric:        Behavior: Behavior normal.     Ortho Exam patient has  mild sciatic notch tenderness on the left negative logroll the right or left hip no pain with extremes of internal rotation of left hip at 30 degrees.  Reflexes are intact she has minimal discomfort straight leg raising 90 degrees.  She is amatory without Trendelenburg gait no trochanteric bursal tenderness.  Specialty Comments:  No specialty comments available.  Imaging: XR HIPS BILAT W OR W/O PELVIS 2V  Result Date: 07/17/2019 Standing AP pelvis to include hips and frog-leg lateral left hip demonstrates mild joint space narrowing left hip with marginal osteophytes on the femur which are small. Impression: Mild right hip osteoarthritis.  X-rays negative for acute changes.    PMFS History: Patient Active Problem List   Diagnosis Date Noted  . Facet degeneration of lumbar region 07/17/2019  . Muscle strain 03/04/2019  . Paresthesia of upper and lower extremities of both sides 09/19/2018  . Bedbug bite 09/19/2018  . Nasal congestion 08/01/2018  . Tension headache 08/01/2018  . Mold exposure  07/14/2018  . Dry mouth 07/14/2018  . Anemia 01/10/2018  . Bipolar disorder ([place]) 12/09/2017  . Leg pain, left 12/09/2017  . Mental health disorder 08/28/2015  . Insomnia 04/14/2015  . Chronic diastolic congestive heart failure ([place]) 01/17/2015  . Auditory hallucinations 08/30/2013  . Diffuse pain 07/27/2013  . Spinal stenosis in cervical region 08/08/2012    Class: Diagnosis of  . HYPERTENSION, BENIGN ESSENTIAL 04/27/2010   Past Medical History:  Diagnosis Date  . Arthritis   . Blood dyscrasia    sickle cell trait  . Fibromyalgia   . GERD (gastroesophageal reflux disease)   . Hx of sickle cell trait   . Hypertension     Family History  Problem Relation Age of Onset  . Hypertension Father   . Cancer Father     Past Surgical History:  Procedure Laterality Date  . ANTERIOR CERVICAL DECOMP/DISCECTOMY FUSION N/A 08/07/2012   Procedure: ANTERIOR CERVICAL DECOMPRESSION/DISCECTOMY FUSION 2 LEVELS;  Surgeon: Marybelle Killings, MD;  Location: [facility];  Service: Orthopedics;  Laterality: N/A;  C5-6, C6-7 ACD&F, ALLOGRAFT AND PLATES  . [place] History   Occupational History  . Not on file  Tobacco Use  . Smoking status: Former Smoker    Packs/day: 0.25    Years: 20.00    Pack years: 5.00    Types: Cigarettes    Quit date: 08/02/1998    Years since quitting: 20.9  . Smokeless tobacco: Never Used  Substance and Sexual Activity  . Alcohol use: No  . Drug use: Yes    Frequency: 21.0 times per week    Types: Marijuana    Comment: 3x daily  . Sexual activity: Not Currently

## 2019-09-19 ENCOUNTER — Other Ambulatory Visit: Payer: Self-pay | Admitting: Family Medicine

## 2019-11-23 IMAGING — CR DG KNEE COMPLETE 4+V*R*
4 series · 4 of 4 positions shown · non-contrast
Comparison: 12/25/2007.

CLINICAL DATA: Knee pain and swelling.

EXAM:
RIGHT KNEE - COMPLETE 4+ VIEW

[t knee ap right]
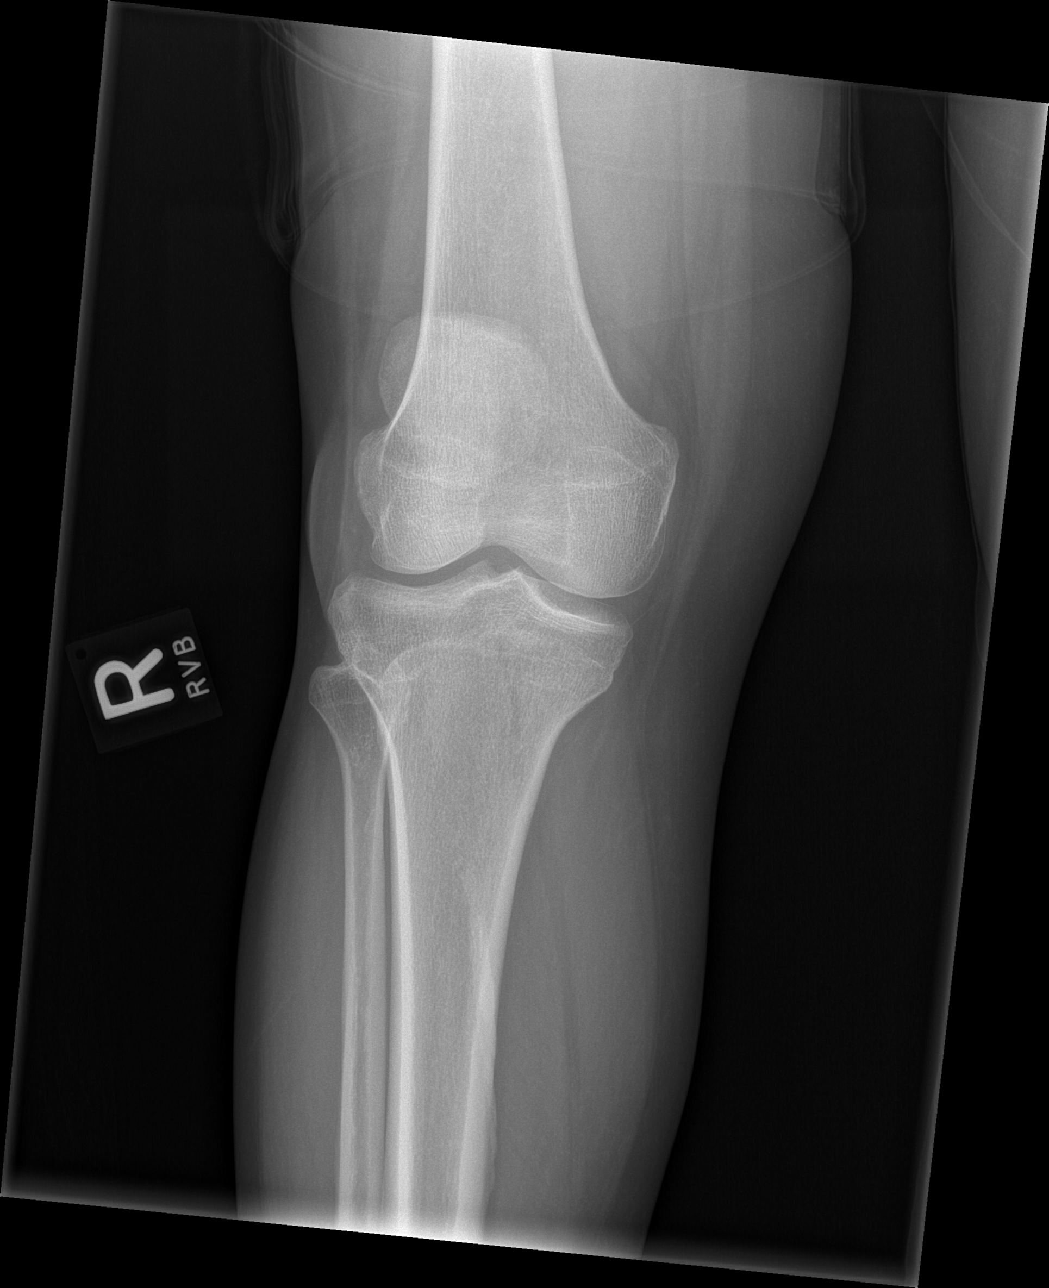

[t knee obl right (1 of 2)]
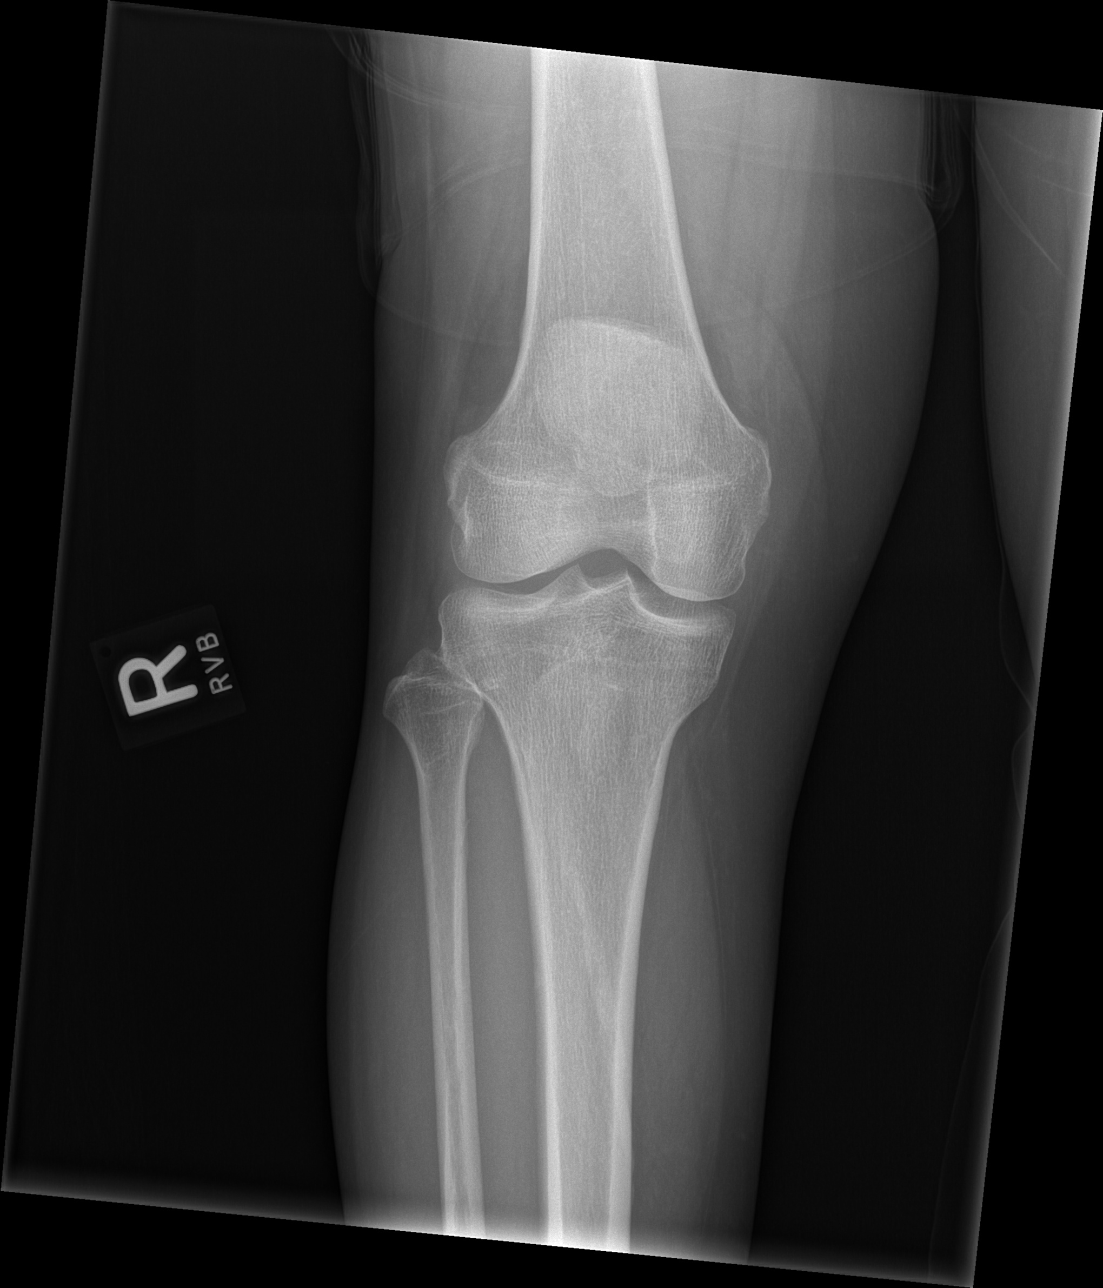

[t knee obl right (2 of 2)]
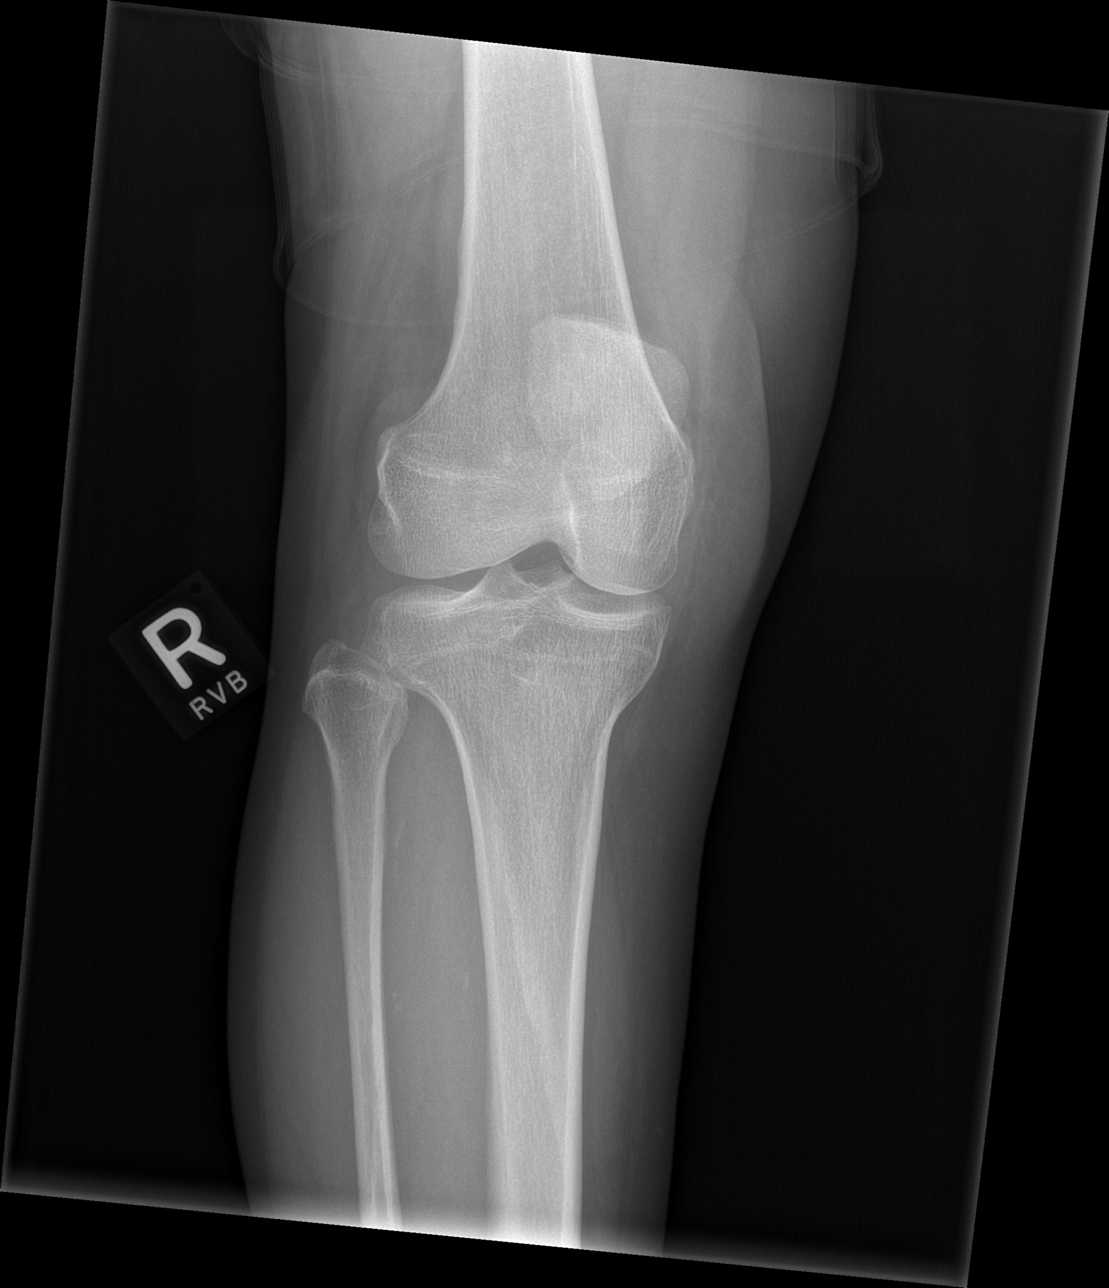

[t knee lat right]
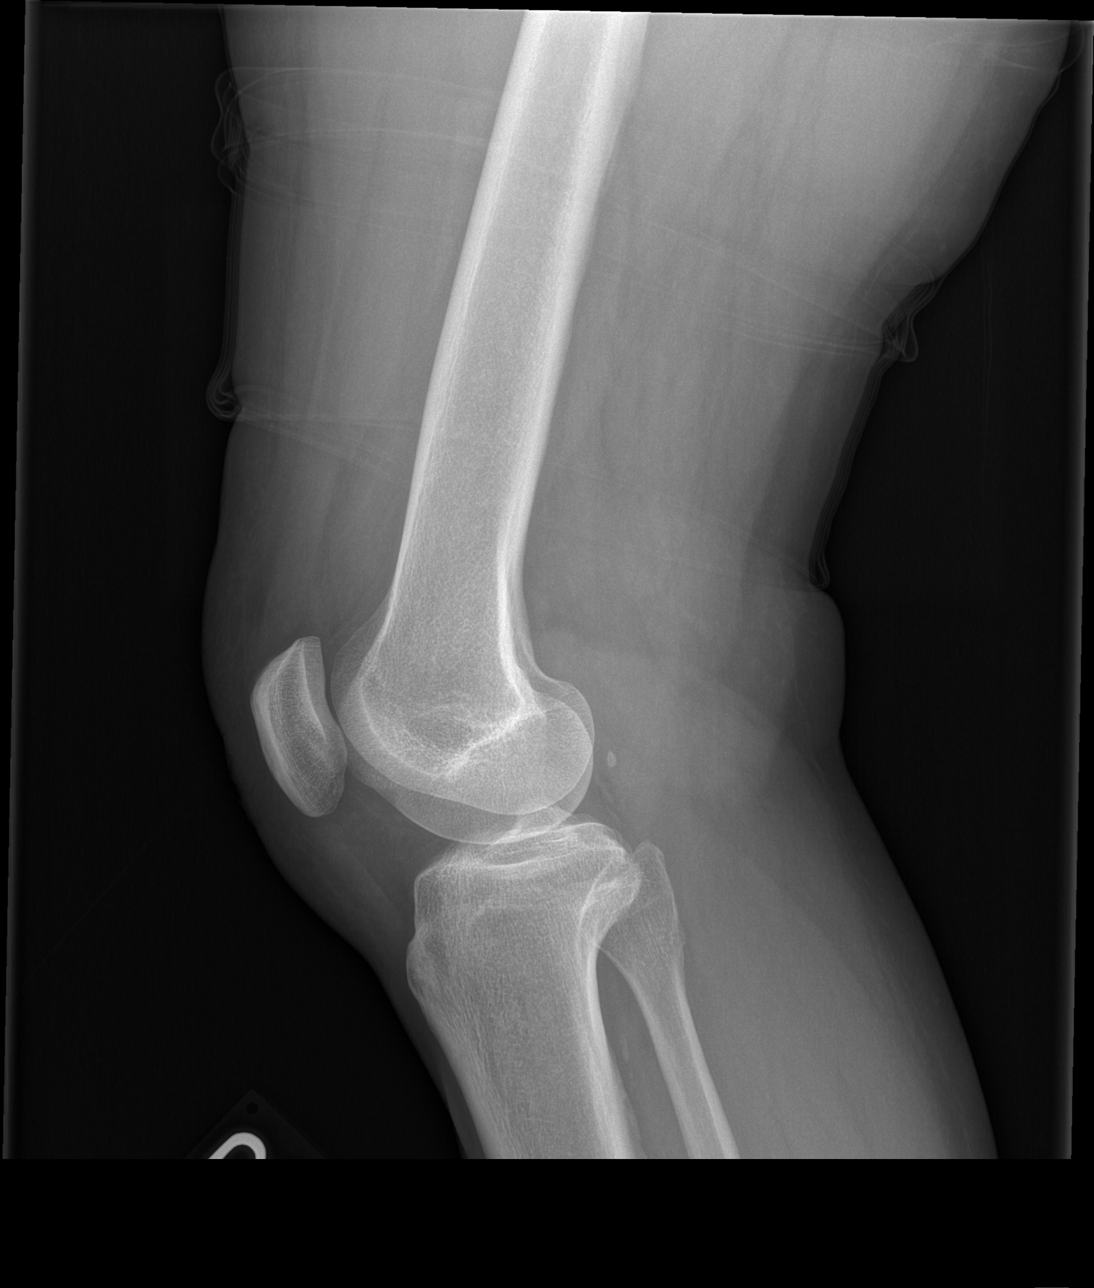

[4 of 4 positions shown; findings below may reference images not displayed]

FINDINGS: No acute bony or joint abnormality identified. No evidence of
fracture or dislocation. Small knee joint effusion cannot be
excluded.
IMPRESSION: Small knee joint effusion cannot be excluded.

2.  No acute bony abnormality identified.

3.  Peripheral vascular disease.

## 2019-11-29 DIAGNOSIS — H2513 Age-related nuclear cataract, bilateral: Secondary | ICD-10-CM | POA: Diagnosis not present

## 2019-11-29 DIAGNOSIS — H25013 Cortical age-related cataract, bilateral: Secondary | ICD-10-CM | POA: Diagnosis not present

## 2019-11-29 DIAGNOSIS — H04123 Dry eye syndrome of bilateral lacrimal glands: Secondary | ICD-10-CM | POA: Diagnosis not present

## 2019-11-29 DIAGNOSIS — H35033 Hypertensive retinopathy, bilateral: Secondary | ICD-10-CM | POA: Diagnosis not present

## 2019-12-17 ENCOUNTER — Other Ambulatory Visit: Payer: Self-pay

## 2019-12-17 ENCOUNTER — Other Ambulatory Visit: Payer: Self-pay | Admitting: Family Medicine

## 2019-12-17 ENCOUNTER — Ambulatory Visit (INDEPENDENT_AMBULATORY_CARE_PROVIDER_SITE_OTHER): Payer: Medicare HMO | Admitting: Family Medicine

## 2019-12-17 ENCOUNTER — Encounter: Payer: Self-pay | Admitting: Family Medicine

## 2019-12-17 VITALS — BP 94/58 | HR 94 | Ht 63.0 in | Wt 158.2 lb

## 2019-12-17 DIAGNOSIS — E785 Hyperlipidemia, unspecified: Secondary | ICD-10-CM

## 2019-12-17 DIAGNOSIS — R202 Paresthesia of skin: Secondary | ICD-10-CM | POA: Diagnosis not present

## 2019-12-17 DIAGNOSIS — D649 Anemia, unspecified: Secondary | ICD-10-CM | POA: Diagnosis not present

## 2019-12-17 DIAGNOSIS — F319 Bipolar disorder, unspecified: Secondary | ICD-10-CM

## 2019-12-17 DIAGNOSIS — M069 Rheumatoid arthritis, unspecified: Secondary | ICD-10-CM

## 2019-12-17 DIAGNOSIS — M25541 Pain in joints of right hand: Secondary | ICD-10-CM

## 2019-12-17 DIAGNOSIS — M25542 Pain in joints of left hand: Secondary | ICD-10-CM

## 2019-12-17 DIAGNOSIS — I10 Essential (primary) hypertension: Secondary | ICD-10-CM

## 2019-12-17 DIAGNOSIS — G629 Polyneuropathy, unspecified: Secondary | ICD-10-CM

## 2019-12-17 LAB — POCT GLYCOSYLATED HEMOGLOBIN (HGB A1C): Hemoglobin A1C: 6 % — AB (ref 4.0–5.6)

## 2019-12-17 MED ORDER — ZOSTER VAC RECOMB ADJUVANTED 50 MCG/0.5ML IM SUSR: 0.5000 mL | Freq: Once | INTRAMUSCULAR | 0 refills | Status: AC

## 2019-12-17 NOTE — Patient Instructions (Addendum)
It was nice to see you today,  For your anemia, I ordered a B12 level and a CBC.  For your polyneuropathy (tingling) I have ordered a B12, A1c labs.  I have ordered a lipid panel and after results of that we can discuss whether you can continue taking your statin or if it is okay to discontinue it.  To work-up for possible rheumatoid arthritis I have ordered several labs. I will follow-up with you when I get the results of all these test.  I would like you to stop taking BC powder completely if possible.  I instead of BC powder I would like you to take Aleve as needed for pain. Do not take more than 2 pills a day. This is a pain reliever that is better for your stomach than the BC powder.  I would like to see you back in 3 to 4 weeks.  Have a great day,  Clemetine Marker, MD

## 2019-12-17 NOTE — Progress Notes (Signed)
SUBJECTIVE:   CHIEF COMPLAINT / HPI:   HTN: Patient is taking valsartan-HCTZ combination pill and amlodipine.  Has no difficulties with these medications and is compliant with the medication.  HLD: Patient takes Lipitor 20 mg daily and is unsure why she takes it.  She has no history of CVA or MI.  She does not think she was ever diagnosed with high cholesterol.  Anemia: Patient has been taking BC powders for pain.  She states she takes these three times a week, but used to take more.  Has never tried Aleve or other NSAID for pain.  Patient went to the gastroenterologist and got a EGD.  She states they told her she had a small ulcer, but was not concerning and did not prescribe her any PPIs or H2 blockers.    Bipolar/schizophrenia: Patient no longer taking Saphris and not seeing anybody at [facility].  She states the pills made her worse and she slept all day.  She is getting a new therapist through her insurance company but has not seen them yet.  Polyneuropathy: Patient has a tingling in her fingers and toes bilaterally.  She states she used to have numbness in her hands before her neck surgery, but since then she "feels everything".  She has brought this up with the surgeon who did her surgery and she states they wanted to start her on oxycodones, but she does not want to take them.  She last saw them 2 months ago.  Patient also states that she feels a localized sensation of pain between the 2nd and 3rd MTP joints of the right foot.  Joint pain: Patient states that she has pain in the joints of her hands.  This includes all joints.  It is worse in the morning.  She states originally it was only in the morning, but now has progressed to the point where the pain is all day for the past three and half months.  She also endorses swelling in those joints.  She does not have knee pain.  Her shoulder pain is improved since her surgery.  Soaking them in water or keeping them warm by putting them under her  arms helps.   Normal physical exam on feet.  Toes are painful at [place] neuroma.    PERTINENT  PMH / PSH: polyneuropathy, htn, spondylosis  OBJECTIVE:   BP (!) 94/58   Pulse 94   Ht 5\' 3"  (1.6 m)   Wt 158 lb 4 oz (71.8 kg)   LMP 07/06/2012   SpO2 97%   BMI 28.03 kg/m   General: Alert and oriented.  No acute distress CV: Regular rate and rhythm.  No murmurs. Pulmonary: Lungs clear auscultation bilaterally. MSK: TTP in between the 2nd and 3rd MTP joints that extends partway up the metatarsals.  Neuro: normal sensation b/l on hands and feet.   ASSESSMENT/PLAN:   HYPERTENSION, BENIGN ESSENTIAL Complaint with valsartan/hctz and amlodipine.  BP 94/58 today.  Consider decreasing in the future if pt continues to have low diastolic pressures.   Hyperlipidemia Pt taking lipitor 20mg . No diabetes and no hx of CVA/MI.  Seems she was placed on it a few years ago after a lipid panel indicated a ascvd risk score of 7.2%.  Guidelines now recommend shared decision making for ascvd risks < 7.5%.  Will get lipid panel and discuss w/ pt on her next visit pros/cons of continuing or stopping the medication.   Paresthesia of upper and lower extremities of both sides  Discussed this in 2020 with pt and asked her to keep a log of occurrences to discuss at 58m f/u.  Does not appear she has done this and did not follow up at one month.  Given her hx of cervical discectomy and fusion d/t spondylosis in 2014  this is likely a sequelae of that or the condition that led to her surgery.  Will get B12 and A1c today.  May benefit from MRI in the future.  Symptoms do not seem to have progressed since her visit last year.    Arthralgia of both hands Complaining of pain in the joints and swelling of the hands bilaterally.  Description of being worst first thing in the morning and lasting throughout the day concerning for RA. Will get RA labs and CRP.  Consider imaging in the future.   Bipolar disorder ([place]) Not  currently on medication.  Not following with [facility]. States she is trying to establish with another therapist.  She does not currently endorse symptoms of depression or mania.  MDQ questionnaire not concerning.    Anemia Asymptomatic, mild microcytic anemia. Pt has decreased her BC powder intake.  EGD according to pt was not concerning and she was not put on any ppi's after the procedure.  Will get cbc today to look for changes.       Benay Pike, MD [place]

## 2019-12-18 DIAGNOSIS — E785 Hyperlipidemia, unspecified: Secondary | ICD-10-CM | POA: Insufficient documentation

## 2019-12-18 LAB — COMPREHENSIVE METABOLIC PANEL
ALT: 13 IU/L (ref 0–32)
AST: 14 IU/L (ref 0–40)
Albumin/Globulin Ratio: 1.4 (ref 1.2–2.2)
Albumin: 4.2 g/dL (ref 3.8–4.9)
Alkaline Phosphatase: 84 IU/L (ref 44–121)
BUN/Creatinine Ratio: 12 (ref 9–23)
BUN: 9 mg/dL (ref 6–24)
Bilirubin Total: 0.3 mg/dL (ref 0.0–1.2)
CO2: 27 mmol/L (ref 20–29)
Calcium: 9.6 mg/dL (ref 8.7–10.2)
Chloride: 95 mmol/L — ABNORMAL LOW (ref 96–106)
Creatinine, Ser: 0.75 mg/dL (ref 0.57–1.00)
GFR calc Af Amer: 102 mL/min/{1.73_m2} (ref >59)
GFR calc non Af Amer: 88 mL/min/{1.73_m2} (ref >59)
Globulin, Total: 3.1 g/dL (ref 1.5–4.5)
Glucose: 94 mg/dL (ref 65–99)
Potassium: 3.1 mmol/L — ABNORMAL LOW (ref 3.5–5.2)
Sodium: 138 mmol/L (ref 134–144)
Total Protein: 7.3 g/dL (ref 6.0–8.5)

## 2019-12-18 LAB — CBC WITH DIFFERENTIAL/PLATELET
Basophils Absolute: 0 10*3/uL (ref 0.0–0.2)
Basos: 0 %
EOS (ABSOLUTE): 0.1 10*3/uL (ref 0.0–0.4)
Eos: 1 %
Hematocrit: 34.6 % (ref 34.0–46.6)
Hemoglobin: 11.4 g/dL (ref 11.1–15.9)
Immature Grans (Abs): 0 10*3/uL (ref 0.0–0.1)
Immature Granulocytes: 0 %
Lymphocytes Absolute: 1.6 10*3/uL (ref 0.7–3.1)
Lymphs: 39 %
MCH: 25.2 pg — ABNORMAL LOW (ref 26.6–33.0)
MCHC: 32.9 g/dL (ref 31.5–35.7)
MCV: 76 fL — ABNORMAL LOW (ref 79–97)
Monocytes Absolute: 0.3 10*3/uL (ref 0.1–0.9)
Monocytes: 8 %
Neutrophils Absolute: 2.1 10*3/uL (ref 1.4–7.0)
Neutrophils: 52 %
Platelets: 287 10*3/uL (ref 150–450)
RBC: 4.53 x10E6/uL (ref 3.77–5.28)
RDW: 16.4 % — ABNORMAL HIGH (ref 11.7–15.4)
WBC: 4.1 10*3/uL (ref 3.4–10.8)

## 2019-12-18 LAB — RHEUMATOID FACTOR: Rheumatoid fact SerPl-aCnc: <10 IU/mL (ref 0.0–13.9)

## 2019-12-18 LAB — LIPID PANEL
Chol/HDL Ratio: 2.6 ratio (ref 0.0–4.4)
Cholesterol, Total: 103 mg/dL (ref 100–199)
HDL: 39 mg/dL — ABNORMAL LOW (ref >39)
LDL Chol Calc (NIH): 51 mg/dL (ref 0–99)
Triglycerides: 55 mg/dL (ref 0–149)
VLDL Cholesterol Cal: 13 mg/dL (ref 5–40)

## 2019-12-18 LAB — VITAMIN B12: Vitamin B-12: 358 pg/mL (ref 232–1245)

## 2019-12-18 LAB — C-REACTIVE PROTEIN: CRP: 2 mg/L (ref 0–10)

## 2019-12-18 NOTE — Assessment & Plan Note (Addendum)
Pt taking lipitor 20mg . No diabetes and no hx of CVA/MI.  Seems she was placed on it a few years ago after a lipid panel indicated a ascvd risk score of 7.2%.  Guidelines now recommend shared decision making for ascvd risks < 7.5%.  Will get lipid panel and discuss w/ pt on her next visit pros/cons of continuing or stopping the medication.

## 2019-12-18 NOTE — Assessment & Plan Note (Signed)
Complaint with valsartan/hctz and amlodipine.  BP 94/58 today.  Consider decreasing in the future if pt continues to have low diastolic pressures.

## 2019-12-19 ENCOUNTER — Other Ambulatory Visit: Payer: Self-pay | Admitting: Family Medicine

## 2019-12-19 DIAGNOSIS — M25541 Pain in joints of right hand: Secondary | ICD-10-CM | POA: Insufficient documentation

## 2019-12-19 DIAGNOSIS — I10 Essential (primary) hypertension: Secondary | ICD-10-CM

## 2019-12-19 DIAGNOSIS — M25542 Pain in joints of left hand: Secondary | ICD-10-CM | POA: Insufficient documentation

## 2019-12-19 NOTE — Assessment & Plan Note (Signed)
Discussed this in 2020 with pt and asked her to keep a log of occurrences to discuss at 69m f/u.  Does not appear she has done this and did not follow up at one month.  Given her hx of cervical discectomy and fusion d/t spondylosis in 2014  this is likely a sequelae of that or the condition that led to her surgery.  Will get B12 and A1c today.  May benefit from MRI in the future.  Symptoms do not seem to have progressed since her visit last year.

## 2019-12-19 NOTE — Assessment & Plan Note (Signed)
Complaining of pain in the joints and swelling of the hands bilaterally.  Description of being worst first thing in the morning and lasting throughout the day concerning for RA. Will get RA labs and CRP.  Consider imaging in the future.

## 2019-12-19 NOTE — Assessment & Plan Note (Signed)
Not currently on medication.  Not following with [facility]. States she is trying to establish with another therapist.  She does not currently endorse symptoms of depression or mania.  MDQ questionnaire not concerning.

## 2019-12-19 NOTE — Assessment & Plan Note (Signed)
Asymptomatic, mild microcytic anemia. Pt has decreased her BC powder intake.  EGD according to pt was not concerning and she was not put on any ppi's after the procedure.  Will get cbc today to look for changes.

## 2019-12-24 LAB — ANTI-CCP AB, IGG + IGA (RDL): Anti-CCP Ab, IgG + IgA (RDL): <20 Units (ref <20)

## 2019-12-27 DIAGNOSIS — F329 Major depressive disorder, single episode, unspecified: Secondary | ICD-10-CM | POA: Diagnosis not present

## 2019-12-27 DIAGNOSIS — F418 Other specified anxiety disorders: Secondary | ICD-10-CM | POA: Diagnosis not present

## 2020-01-04 ENCOUNTER — Encounter: Payer: Self-pay | Admitting: Family Medicine

## 2020-01-04 ENCOUNTER — Ambulatory Visit (INDEPENDENT_AMBULATORY_CARE_PROVIDER_SITE_OTHER): Payer: Medicare HMO | Admitting: Family Medicine

## 2020-01-04 ENCOUNTER — Other Ambulatory Visit: Payer: Self-pay

## 2020-01-04 VITALS — BP 102/60 | HR 84 | Ht 63.0 in | Wt 159.0 lb

## 2020-01-04 DIAGNOSIS — E876 Hypokalemia: Secondary | ICD-10-CM

## 2020-01-04 DIAGNOSIS — R202 Paresthesia of skin: Secondary | ICD-10-CM

## 2020-01-04 NOTE — Progress Notes (Signed)
    SUBJECTIVE:   CHIEF COMPLAINT / HPI:   We discussed the patient's previous lab results.    The patient is still having numbness in the fingers and toes. Putting them in Warm water makes it better.  The last few weeks her last three toes have started to deviate laterally while she is sleeping. Other family members have seen it as well. Her toes do not feel like they are cramping at the time this is happneing.       PERTINENT  PMH / PSH: cervical spinal stenosis.    OBJECTIVE:   BP 102/60   Pulse 84   Ht 5\' 3"  (1.6 m)   Wt 159 lb (72.1 kg)   LMP 07/06/2012   SpO2 96%   BMI 28.17 kg/m   Gen: alert. Oriented.  No acute distress.   Cv: RRR Pulm: LCTAB Neuro: vibratory and pinprick sensation intact in the feet.  Sensation intact to light touch in the upper and lower extremities.   ASSESSMENT/PLAN:   Paresthesia of upper and lower extremities of both sides Will refer to PM&R for Neuromuscular studies and further treatment options.  May need repeat of her cervical MRI; last one was in 2015. Deviation of toes sounds most like muscle cramping, which pt may not feel due to her neuropathy.       Benay Pike, MD [place]

## 2020-01-04 NOTE — Patient Instructions (Signed)
It was nice to see you again today,  I will put in a referral for nerve conduction studies to be done by the physical medicine rehabilitation doctor.  This may take a few weeks to set up.  If you have not heard from someone from their office in 2 weeks I would call our office.  At some point we may need to repeat your neck imaging.  I am going to repeat the blood test that checks your potassium as it was mildly low.  I expected to be better today, but I will let you know the results when I get them.  All of your other lab tests from our last visit were normal.  Please schedule appointment with me for 4 to 6 weeks from now so that we can discuss the results from your visit to the PM&R doctor.  Have a great day,  Clemetine Marker, MD

## 2020-01-05 LAB — BASIC METABOLIC PANEL
BUN/Creatinine Ratio: 19 (ref 9–23)
BUN: 15 mg/dL (ref 6–24)
CO2: 28 mmol/L (ref 20–29)
Calcium: 10 mg/dL (ref 8.7–10.2)
Chloride: 98 mmol/L (ref 96–106)
Creatinine, Ser: 0.8 mg/dL (ref 0.57–1.00)
GFR calc Af Amer: 94 mL/min/{1.73_m2} (ref >59)
GFR calc non Af Amer: 82 mL/min/{1.73_m2} (ref >59)
Glucose: 85 mg/dL (ref 65–99)
Potassium: 3.1 mmol/L — ABNORMAL LOW (ref 3.5–5.2)
Sodium: 140 mmol/L (ref 134–144)

## 2020-01-06 NOTE — Assessment & Plan Note (Addendum)
Will refer to PM&R for Neuromuscular studies and further treatment options.  May need repeat of her cervical MRI; last one was in 2015. Deviation of toes sounds most like muscle cramping, which pt may not feel due to her neuropathy.

## 2020-01-08 ENCOUNTER — Other Ambulatory Visit: Payer: Self-pay | Admitting: Family Medicine

## 2020-01-08 DIAGNOSIS — E876 Hypokalemia: Secondary | ICD-10-CM

## 2020-01-08 MED ORDER — POTASSIUM CHLORIDE ER 10 MEQ PO TBCR: 20.0000 meq | EXTENDED_RELEASE_TABLET | Freq: Every day | ORAL | 1 refills | Status: DC

## 2020-01-08 NOTE — Progress Notes (Signed)
Will give pt K-dur for low K, which is likely due to medication use.  No signs of kidney dysfunction.  Repeat lab at next visit.

## 2020-02-16 ENCOUNTER — Other Ambulatory Visit: Payer: Self-pay | Admitting: Family Medicine

## 2020-03-03 ENCOUNTER — Other Ambulatory Visit: Payer: Self-pay | Admitting: Family Medicine

## 2020-03-05 ENCOUNTER — Other Ambulatory Visit (HOSPITAL_COMMUNITY)
Admission: RE | Admit: 2020-03-05 | Discharge: 2020-03-05 | Disposition: A | Discharge status: Home / Self Care | Payer: Medicare HMO | Source: Ambulatory Visit | Attending: Family Medicine | Admitting: Family Medicine

## 2020-03-05 ENCOUNTER — Other Ambulatory Visit: Payer: Self-pay

## 2020-03-05 ENCOUNTER — Ambulatory Visit (INDEPENDENT_AMBULATORY_CARE_PROVIDER_SITE_OTHER): Payer: Medicare HMO | Admitting: Family Medicine

## 2020-03-05 ENCOUNTER — Encounter: Payer: Self-pay | Admitting: Family Medicine

## 2020-03-05 VITALS — BP 115/70 | HR 63 | Ht 63.0 in | Wt 158.8 lb

## 2020-03-05 DIAGNOSIS — N898 Other specified noninflammatory disorders of vagina: Secondary | ICD-10-CM | POA: Diagnosis not present

## 2020-03-05 DIAGNOSIS — Z1151 Encounter for screening for human papillomavirus (HPV): Secondary | ICD-10-CM | POA: Insufficient documentation

## 2020-03-05 DIAGNOSIS — Z124 Encounter for screening for malignant neoplasm of cervix: Secondary | ICD-10-CM | POA: Diagnosis not present

## 2020-03-05 NOTE — Assessment & Plan Note (Signed)
Patient with several day history of vaginal discharge and odor.  Physical exam consistent with possible BV versus Candida infection.  Wet prep collected today.  Pap smear also collected today as it is due in 3 weeks.  Strict ED/return precautions given.  Will call patient tomorrow with results from wet prep and call any antibiotics/medications that she may need into her pharmacy.

## 2020-03-05 NOTE — Progress Notes (Signed)
    SUBJECTIVE:   CHIEF COMPLAINT / HPI:   Vaginal discharge with odor Has been going on for several days.  Is not sexually active and has not been sexually active recently.  Reports that over the past few days she has noticed increased discharge and some odor with her vaginal discharge.  Denies any bleeding or cramping.  Reports she has had bacterial vaginosis in the past when she was pregnant.   OBJECTIVE:   BP 115/70   Pulse 63   Ht 5\' 3"  (1.6 m)   Wt 158 lb 12.8 oz (72 kg)   LMP 07/06/2012   SpO2 97%   BMI 28.13 kg/m   Well-appearing 60 year old female, no acute distress Cardiac: Regular rate and rhythm, no murmurs appreciated Respiratory: Normal work of breathing GU: Normal external vaginal tissue, normal internal vaginal tissue, mild to moderate yellowish tinged discharge, cervix with no abnormalities, nonfriable  ASSESSMENT/PLAN:   Vaginal discharge Patient with several day history of vaginal discharge and odor.  Physical exam consistent with possible BV versus Candida infection.  Wet prep collected today.  Pap smear also collected today as it is due in 3 weeks.  Strict ED/return precautions given.  Will call patient tomorrow with results from wet prep and call any antibiotics/medications that she may need into her pharmacy.     Gifford Shave, MD [place]

## 2020-03-05 NOTE — Patient Instructions (Signed)
It was great seeing you today.  I am sorry you are having these issues with the vaginal discharge.  This is most likely bacterial vaginosis.  We collected the samples and they will be sent off and the results should come back sometime tomorrow.  When they come back I will call you and call in what ever medication is needed.  We also collected a Pap smear today, those results may take a week to come back but I will call you with those results as well.  If you have any worsening symptoms, questions, concerns please feel free to call the clinic.  I hope you have a wonderful afternoon!   Bacterial Vaginosis  Bacterial vaginosis is an infection that occurs when the normal balance of bacteria in the vagina changes. This change is caused by an overgrowth of certain bacteria in the vagina. Bacterial vaginosis is the most common vaginal infection among females aged 48 to 81 years. This condition increases the risk of sexually transmitted infections (STIs). Treatment can help reduce this risk. Treatment is very important for pregnant women because this condition can cause babies to be born early (prematurely) or at a low birth weight. What are the causes? This condition is caused by an increase in harmful bacteria that are normally present in small amounts in the vagina. However, the exact reason this condition develops is not known. You cannot get bacterial vaginosis from toilet seats, bedding, swimming pools, or contact with objects around you. What increases the risk? The following factors may make you more likely to develop this condition:  Having a new sexual partner or multiple sexual partners, or having unprotected sex.  Douching.  Having an intrauterine device (IUD).  Smoking.  Abusing drugs and alcohol. This may lead to riskier sexual behavior.  Taking certain antibiotic medicines.  Being pregnant. What are the signs or symptoms? Some women with this condition have no symptoms. Symptoms may  include:  Pearline Cables or white vaginal discharge. The discharge can be watery or foamy.  A fish-like odor with discharge, especially after sex or during menstruation.  Itching in and around the vagina.  Burning or pain with urination. How is this diagnosed? This condition is diagnosed based on:  Your medical history.  A physical exam of the vagina.  Checking a sample of vaginal fluid for harmful bacteria or abnormal cells. How is this treated? This condition is treated with antibiotic medicines. These may be given as a pill, a vaginal cream, or a medicine that is put into the vagina (suppository). If the condition comes back after treatment, a second round of antibiotics may be needed. Follow these instructions at home: Medicines  Take or apply over-the-counter and prescription medicines only as told by your health care provider.  Take or apply your antibiotic medicine as told by your health care provider. Do not stop using the antibiotic even if you start to feel better. General instructions  If you have a female sexual partner, tell her that you have a vaginal infection. She should follow up with her health care provider. If you have a female sexual partner, he does not need treatment.  Avoid sexual activity until you finish treatment.  Drink enough fluid to keep your urine pale yellow.  Keep the area around your vagina and rectum clean. ? Wash the area daily with warm water. ? Wipe yourself from front to back after using the toilet.  If you are breastfeeding, talk to your health care provider about continuing breastfeeding during  treatment.  Keep all follow-up visits. This is important. How is this prevented? Self-care  Do not douche.  Wash the outside of your vagina with warm water only.  Wear cotton or cotton-lined underwear.  Avoid wearing tight pants and pantyhose, especially during the summer. Safe sex  Use protection when having sex. This includes: ? Using  condoms. ? Using dental dams. This is a thin layer of a material made of latex or polyurethane that protects the mouth during oral sex.  Limit the number of sexual partners. To help prevent bacterial vaginosis, it is best to have sex with just one partner (monogamous relationship).  Make sure you and your sexual partner are tested for STIs. Drugs and alcohol  Do not use any products that contain nicotine or tobacco. These products include cigarettes, chewing tobacco, and vaping devices, such as e-cigarettes. If you need help quitting, ask your health care provider.  Do not use drugs.  Do not drink alcohol if: ? Your health care provider tells you not to do this. ? You are pregnant, may be pregnant, or are planning to become pregnant.  If you drink alcohol: ? Limit how much you have to 0-1 drink a day. ? Be aware of how much alcohol is in your drink. In the U.S., one drink equals one 12 oz bottle of beer (355 mL), one 5 oz glass of wine (148 mL), or one 1 oz glass of hard liquor (44 mL). Where to find more information  Centers for Disease Control and Prevention: http://www.wolf.info/  American Sexual Health Association (ASHA): www.ashastd.org  U.S. Department of Health and Financial controller, Office on Women's Health: [place]Signs.tn Contact a health care provider if:  Your symptoms do not improve, even after treatment.  You have more discharge or pain when urinating.  You have a fever or chills.  You have pain in your abdomen or pelvis.  You have pain during sex.  You have vaginal bleeding between menstrual periods. Summary  Bacterial vaginosis is a vaginal infection that occurs when the normal balance of bacteria in the vagina changes. It results from an overgrowth of certain bacteria.  This condition increases the risk of sexually transmitted infections (STIs). Getting treated can help reduce this risk.  Treatment is very important for pregnant women because this condition can  cause babies to be born early (prematurely) or at low birth weight.  This condition is treated with antibiotic medicines. These may be given as a pill, a vaginal cream, or a medicine that is put into the vagina (suppository). This information is not intended to replace advice given to you by your health care provider. Make sure you discuss any questions you have with your health care provider. Document Revised: 08/02/2019 Document Reviewed: 08/02/2019 Elsevier Patient Education  [place].

## 2020-03-07 LAB — CERVICOVAGINAL ANCILLARY ONLY
Bacterial Vaginitis (gardnerella): NEGATIVE
Candida Glabrata: NEGATIVE
Candida Vaginitis: NEGATIVE
Comment: NEGATIVE
Comment: NEGATIVE
Comment: NEGATIVE

## 2020-03-10 LAB — CYTOLOGY - PAP
Comment: NEGATIVE
Diagnosis: NEGATIVE
High risk HPV: NEGATIVE

## 2020-03-13 ENCOUNTER — Telehealth: Payer: Self-pay

## 2020-03-13 NOTE — Telephone Encounter (Signed)
Patient calls nurse line regarding test results from visit on 1/19. Informed patient of normal results.   Talbot Grumbling, RN

## 2020-04-21 ENCOUNTER — Other Ambulatory Visit: Payer: Self-pay | Admitting: Family Medicine

## 2020-04-23 ENCOUNTER — Other Ambulatory Visit: Payer: Self-pay | Admitting: Family Medicine

## 2020-04-23 DIAGNOSIS — I10 Essential (primary) hypertension: Secondary | ICD-10-CM

## 2020-05-22 ENCOUNTER — Telehealth: Payer: Self-pay

## 2020-05-22 NOTE — Telephone Encounter (Signed)
Patient calls nurse line reporting a fall. Patient reports she tripped on a rug and fell backwards onto her bottom. Patient denies pain or swelling at this time. Patient did not hit her head. Patient advised to call and make an apt if pain or swelling takes place. Patient agreed with plan.

## 2020-05-23 ENCOUNTER — Other Ambulatory Visit: Payer: Self-pay | Admitting: Family Medicine

## 2020-05-23 DIAGNOSIS — Z1231 Encounter for screening mammogram for malignant neoplasm of breast: Secondary | ICD-10-CM

## 2020-05-28 ENCOUNTER — Ambulatory Visit
Admission: RE | Admit: 2020-05-28 | Discharge: 2020-05-28 | Disposition: A | Discharge status: Home / Self Care | Payer: Medicare HMO | Source: Ambulatory Visit | Attending: Internal Medicine | Admitting: Internal Medicine

## 2020-05-28 ENCOUNTER — Other Ambulatory Visit: Payer: Self-pay

## 2020-05-28 DIAGNOSIS — Z1231 Encounter for screening mammogram for malignant neoplasm of breast: Secondary | ICD-10-CM | POA: Diagnosis not present

## 2020-07-25 ENCOUNTER — Other Ambulatory Visit: Payer: Self-pay | Admitting: Family Medicine

## 2020-07-25 DIAGNOSIS — I10 Essential (primary) hypertension: Secondary | ICD-10-CM

## 2020-12-15 ENCOUNTER — Other Ambulatory Visit: Payer: Self-pay | Admitting: Family Medicine

## 2020-12-15 DIAGNOSIS — I10 Essential (primary) hypertension: Secondary | ICD-10-CM

## 2020-12-30 DIAGNOSIS — Z0100 Encounter for examination of eyes and vision without abnormal findings: Secondary | ICD-10-CM | POA: Diagnosis not present

## 2020-12-30 DIAGNOSIS — H5210 Myopia, unspecified eye: Secondary | ICD-10-CM | POA: Diagnosis not present

## 2021-03-21 ENCOUNTER — Other Ambulatory Visit: Payer: Self-pay | Admitting: Family Medicine

## 2021-03-21 DIAGNOSIS — I10 Essential (primary) hypertension: Secondary | ICD-10-CM

## 2021-04-17 DIAGNOSIS — Z03818 Encounter for observation for suspected exposure to other biological agents ruled out: Secondary | ICD-10-CM | POA: Diagnosis not present

## 2021-04-17 DIAGNOSIS — Z20822 Contact with and (suspected) exposure to covid-19: Secondary | ICD-10-CM | POA: Diagnosis not present

## 2021-05-01 ENCOUNTER — Other Ambulatory Visit: Payer: Self-pay | Admitting: Internal Medicine

## 2021-05-01 DIAGNOSIS — Z1231 Encounter for screening mammogram for malignant neoplasm of breast: Secondary | ICD-10-CM

## 2021-05-26 ENCOUNTER — Ambulatory Visit
Admission: RE | Admit: 2021-05-26 | Discharge: 2021-05-26 | Disposition: A | Discharge status: Home / Self Care | Payer: Medicare Other | Source: Ambulatory Visit | Attending: Family Medicine | Admitting: Family Medicine

## 2021-05-26 ENCOUNTER — Encounter: Payer: Self-pay | Admitting: Family Medicine

## 2021-05-26 ENCOUNTER — Ambulatory Visit (INDEPENDENT_AMBULATORY_CARE_PROVIDER_SITE_OTHER): Payer: Medicare Other | Admitting: Family Medicine

## 2021-05-26 VITALS — BP 102/61 | HR 88 | Ht 63.0 in | Wt 179.1 lb

## 2021-05-26 DIAGNOSIS — I5032 Chronic diastolic (congestive) heart failure: Secondary | ICD-10-CM | POA: Diagnosis not present

## 2021-05-26 DIAGNOSIS — Z23 Encounter for immunization: Secondary | ICD-10-CM | POA: Diagnosis not present

## 2021-05-26 DIAGNOSIS — F319 Bipolar disorder, unspecified: Secondary | ICD-10-CM

## 2021-05-26 DIAGNOSIS — M79641 Pain in right hand: Secondary | ICD-10-CM

## 2021-05-26 DIAGNOSIS — G8929 Other chronic pain: Secondary | ICD-10-CM

## 2021-05-26 DIAGNOSIS — E785 Hyperlipidemia, unspecified: Secondary | ICD-10-CM

## 2021-05-26 DIAGNOSIS — I10 Essential (primary) hypertension: Secondary | ICD-10-CM

## 2021-05-26 DIAGNOSIS — Z0000 Encounter for general adult medical examination without abnormal findings: Secondary | ICD-10-CM

## 2021-05-26 DIAGNOSIS — M19041 Primary osteoarthritis, right hand: Secondary | ICD-10-CM | POA: Diagnosis not present

## 2021-05-26 NOTE — Assessment & Plan Note (Addendum)
Off of medications since 2020 and apparently has not had any issues.  She is currently on working on getting established with psychiatry.  In the meantime, I provided her with psychology today website to assist with therapy.  Discussed that I can place a new referral if she requires. ?

## 2021-05-26 NOTE — Patient Instructions (Addendum)
It was nice seeing you today! ? ?[facility] ?Address: [place], [place], [place] [address] ?Phone: [phone]  ? ?Stay well, ?Zola Button, MD ?[place] ?[phone] ? ?-- ? ?Make sure to check out at the front desk before you leave today. ? ?Please arrive at least 15 minutes prior to your scheduled appointments. ? ?If you had blood work today, I will send you a MyChart message or a letter if results are normal. Otherwise, I will give you a call. ? ?If you had a referral placed, they will call you to set up an appointment. Please give [place] a call if you don't hear back in the next 2 weeks. ? ?If you need additional refills before your next appointment, please call your pharmacy first.  ?

## 2021-05-26 NOTE — Assessment & Plan Note (Signed)
On statin, will repeat lipid panel today ?

## 2021-05-26 NOTE — Assessment & Plan Note (Signed)
HTN well-controlled. ?

## 2021-05-26 NOTE — Assessment & Plan Note (Signed)
Well-controlled with current medications.  BP appears to be on the low end of normal but appears to be tolerating medications well.  Check BMP today. ?
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
## 2021-05-27 ENCOUNTER — Encounter: Payer: Self-pay | Admitting: Family Medicine

## 2021-05-27 LAB — LIPID PANEL
Chol/HDL Ratio: 2.7 ratio (ref 0.0–4.4)
Cholesterol, Total: 111 mg/dL (ref 100–199)
HDL: 41 mg/dL (ref >39)
LDL Chol Calc (NIH): 49 mg/dL (ref 0–99)
Triglycerides: 118 mg/dL (ref 0–149)
VLDL Cholesterol Cal: 21 mg/dL (ref 5–40)

## 2021-05-27 LAB — BASIC METABOLIC PANEL
BUN/Creatinine Ratio: 13 (ref 12–28)
BUN: 10 mg/dL (ref 8–27)
CO2: 29 mmol/L (ref 20–29)
Calcium: 9.4 mg/dL (ref 8.7–10.3)
Chloride: 96 mmol/L (ref 96–106)
Creatinine, Ser: 0.79 mg/dL (ref 0.57–1.00)
Glucose: 100 mg/dL — ABNORMAL HIGH (ref 70–99)
Potassium: 3.1 mmol/L — ABNORMAL LOW (ref 3.5–5.2)
Sodium: 139 mmol/L (ref 134–144)
eGFR: 86 mL/min/{1.73_m2} (ref >59)

## 2021-05-29 ENCOUNTER — Ambulatory Visit
Admission: RE | Admit: 2021-05-29 | Discharge: 2021-05-29 | Disposition: A | Discharge status: Home / Self Care | Payer: Medicare Other | Source: Ambulatory Visit | Attending: Internal Medicine | Admitting: Internal Medicine

## 2021-05-29 DIAGNOSIS — Z1231 Encounter for screening mammogram for malignant neoplasm of breast: Secondary | ICD-10-CM | POA: Diagnosis not present

## 2021-05-30 NOTE — Progress Notes (Signed)
Duplicate encounter, Covid bivalent booster given. ?

## 2021-06-26 ENCOUNTER — Other Ambulatory Visit: Payer: Self-pay

## 2021-06-26 MED ORDER — ATORVASTATIN CALCIUM 20 MG PO TABS: 20.0000 mg | ORAL_TABLET | Freq: Every day | ORAL | 3 refills | Status: DC

## 2021-06-30 ENCOUNTER — Other Ambulatory Visit: Payer: Self-pay | Admitting: Family Medicine

## 2021-06-30 DIAGNOSIS — I10 Essential (primary) hypertension: Secondary | ICD-10-CM

## 2021-07-07 ENCOUNTER — Other Ambulatory Visit: Payer: Self-pay

## 2021-07-07 DIAGNOSIS — I10 Essential (primary) hypertension: Secondary | ICD-10-CM

## 2021-07-07 MED ORDER — VALSARTAN-HYDROCHLOROTHIAZIDE 160-25 MG PO TABS: 1.0000 | ORAL_TABLET | Freq: Every day | ORAL | 3 refills | Status: DC

## 2021-07-21 ENCOUNTER — Encounter: Payer: Self-pay | Admitting: *Deleted

## 2021-07-26 NOTE — Progress Notes (Deleted)
    SUBJECTIVE:   CHIEF COMPLAINT / HPI:  No chief complaint on file.   Last visit 2 months ago had pain in right 4th digit for months, no injury. XR obtained showed signs of OA. Declined medication.  PERTINENT  PMH / PSH: HTN, HLD, bipolar disorder  Patient Care Team: Zola Button, MD as PCP - General (Family Medicine)   OBJECTIVE:   LMP 07/06/2012   Physical Exam      03/05/2020    4:35 PM  Depression screen PHQ 2/9  Decreased Interest 2  Down, Depressed, Hopeless 3  PHQ - 2 Score 5  Altered sleeping 3  Tired, decreased energy 2  Change in appetite 2  Feeling bad or failure about yourself  3  Trouble concentrating 0  Moving slowly or fidgety/restless 0  Suicidal thoughts 0  PHQ-9 Score 15     {Show previous vital signs (optional):23777}  {Labs  Heme  Chem  Endocrine  Serology  Results Review (optional):23779}  ASSESSMENT/PLAN:   No problem-specific Assessment & Plan notes found for this encounter.    No follow-ups on file.   Zola Button, MD [place]

## 2021-07-26 NOTE — Patient Instructions (Incomplete)
It was nice seeing you today!  Blood work today.  See me in 3 months or whenever is a good for you.  Stay well, Anyae Griffith, MD [facility] [phone]  --  Make sure to check out at the front desk before you leave today.  Please arrive at least 15 minutes prior to your scheduled appointments.  If you had blood work today, I will send you a MyChart message or a letter if results are normal. Otherwise, I will give you a call.  If you had a referral placed, they will call you to set up an appointment. Please give us a call if you don't hear back in the next 2 weeks.  If you need additional refills before your next appointment, please call your pharmacy first.  

## 2021-07-27 ENCOUNTER — Ambulatory Visit: Payer: Medicare Other | Admitting: Family Medicine

## 2021-08-21 NOTE — Patient Instructions (Incomplete)
It was nice seeing you today!  Keep using the Band-aid splint like we talked about.  You can try Voltaren gel for pain.  Referral to sports medicine.  Specific Provider options Psychology Today  https://www.psychologytoday.com/us click on find a therapist  enter your zip code left side and select or tailor a therapist for your specific need.   Stay well, Debbie Deeds, MD [facility] [phone]  --  Make sure to check out at the front desk before you leave today.  Please arrive at least 15 minutes prior to your scheduled appointments.  If you had blood work today, I will send you a MyChart message or a letter if results are normal. Otherwise, I will give you a call.  If you had a referral placed, they will call you to set up an appointment. Please give [place] a call if you don't hear back in the next 2 weeks.  If you need additional refills before your next appointment, please call your pharmacy first.  --  Therapy and Counseling Resources Most providers on this list will take Medicaid. Patients with commercial insurance or Medicare should contact their insurance company to get a list of in network providers.  [facility] (spanish speaking therapist available)(habla espanol)(take medicare and medicaid)  [address], [place], [place] [address], [email or web address] [email or web address] [phone]  [facility]ing [address] [place], [place] [address] [phone]  [facility]   [address] [place], [place] [address]      [phone]  [facility] (spanish available) [address]  [place], [place] [address] [phone]  [facility] (take [facility] and medicare) [address], [address]  [place], [place] [address]       [phone]     [facility] (virtual only) [phone]  [facility]ovita [facility]ussmaul [facility] [address] [place],  [place] [phone]  [facility]:  [address] [place] [phone]  [facility]:  [address]iener [phone]  [facility] (under & uninsured) [address]   [place] [phone]    [email or web address]    [facility] [address]  [place]    [phone]  [facility] -[address]     Phone:  [phone]     [facility]-  [address]  [phone]   [facility] [address]      [place], [place] [phone]  [facility]: [address] [place], [place], [place]  [phone]   [facility] (Spanish therapist) [email or web address]  [address] [place]  [address]   [place] [address]    [phone]    [facility]   [address],  [place], [place]  [phone]   [facility]  [address] [place]  [phone]  [facility]  [address] [place], [place]        [phone]  Open Access/Walk In Clinic under & uninsured  [facility]  [address] [place], [place] [phone] Crisis [phone]  [facility] [address],  (Spanish)   [address], [place]: [phone]) 8:30 - 12; 1 - 2:30  [facility] [employer],  [address] [place], [place]    [phone]):8:30 - 12; 2 -  3PM  [facility] [employer],  [address],  [place]; [phone]):   Mon - Fri 8 AM - 5 PM  [facility] [address] [place]  MWF 12:30 to 3:00 or call to schedule an appointment  [phone]  Specific Provider options Psychology Today  https://www.psychologytoday.com/us click on find a therapist  enter your zip code left side and select or tailor a therapist for your specific need.   [facility] Provider  Directory [email or web address]  (Medicaid)   Follow all drop down to find a provider  Social Support program [facility] [place] [phone] or PhotoSolver.pl [address], [place], [place] Recovery support and educational   24- Hour Availability:   [facility]  [address] [place], [place] [phone] Crisis [phone]  [facility] of the [facility] [phone]  [place] [facility]  [phone]   [facility]  [phone] (after hours)  Therapeutic Alternative/Mobile Crisis   [phone]  [place] National Suicide Hotline  [phone] Len Childs)  Call 911 or go to emergency room  [facility]  [phone]);  [place] and [employer]  [phone]); [place], [place], [place], [place], [place], [place], [place]

## 2021-08-21 NOTE — Progress Notes (Unsigned)
    SUBJECTIVE:   CHIEF COMPLAINT / HPI:  No chief complaint on file.   ***  PERTINENT  PMH / PSH: ***  Patient Care Team: Zola Button, MD as PCP - General (Family Medicine)   OBJECTIVE:   LMP 07/06/2012   Physical Exam      03/05/2020    4:35 PM  Depression screen PHQ 2/9  Decreased Interest 2  Down, Depressed, Hopeless 3  PHQ - 2 Score 5  Altered sleeping 3  Tired, decreased energy 2  Change in appetite 2  Feeling bad or failure about yourself  3  Trouble concentrating 0  Moving slowly or fidgety/restless 0  Suicidal thoughts 0  PHQ-9 Score 15     {Show previous vital signs (optional):23777}  {Labs  Heme  Chem  Endocrine  Serology  Results Review (optional):23779}  ASSESSMENT/PLAN:   No problem-specific Assessment & Plan notes found for this encounter.    No follow-ups on file.   Zola Button, MD [place]

## 2021-08-24 ENCOUNTER — Ambulatory Visit (INDEPENDENT_AMBULATORY_CARE_PROVIDER_SITE_OTHER): Payer: Medicare Other | Admitting: Family Medicine

## 2021-08-24 ENCOUNTER — Encounter: Payer: Self-pay | Admitting: Family Medicine

## 2021-08-24 VITALS — BP 101/60 | HR 67 | Ht 63.0 in | Wt 178.6 lb

## 2021-08-24 DIAGNOSIS — M65341 Trigger finger, right ring finger: Secondary | ICD-10-CM

## 2021-08-24 DIAGNOSIS — F319 Bipolar disorder, unspecified: Secondary | ICD-10-CM

## 2021-08-24 NOTE — Assessment & Plan Note (Signed)
Off medications since 2020 but apparently stable. Declines psychiatry referral. Therapy resources provided.

## 2021-10-10 ENCOUNTER — Other Ambulatory Visit: Payer: Self-pay | Admitting: Family Medicine

## 2021-10-10 DIAGNOSIS — I10 Essential (primary) hypertension: Secondary | ICD-10-CM

## 2022-01-06 ENCOUNTER — Other Ambulatory Visit: Payer: Self-pay | Admitting: Family Medicine

## 2022-01-06 DIAGNOSIS — I10 Essential (primary) hypertension: Secondary | ICD-10-CM

## 2022-04-14 ENCOUNTER — Telehealth: Payer: Self-pay | Admitting: Family Medicine

## 2022-04-14 NOTE — Telephone Encounter (Signed)
Called patient to schedule Medicare Annual Wellness Visit (AWV). Left message for patient to call back and schedule Medicare Annual Wellness Visit (AWV).  Last date of AWV: AWVI eligible as of 06/16/2015  Please schedule an appointment at any time with [facility].  If any questions, please contact me at [phone].   Thank you,  [place] Direct dial  [phone]

## 2022-04-15 ENCOUNTER — Other Ambulatory Visit: Payer: Self-pay | Admitting: Family Medicine

## 2022-04-15 DIAGNOSIS — I10 Essential (primary) hypertension: Secondary | ICD-10-CM

## 2022-04-23 ENCOUNTER — Encounter: Payer: Self-pay | Admitting: Family Medicine

## 2022-04-23 ENCOUNTER — Ambulatory Visit (INDEPENDENT_AMBULATORY_CARE_PROVIDER_SITE_OTHER): Payer: 59 | Admitting: Family Medicine

## 2022-04-23 VITALS — BP 106/69 | HR 85 | Ht 63.0 in | Wt 182.8 lb

## 2022-04-23 DIAGNOSIS — Z0000 Encounter for general adult medical examination without abnormal findings: Secondary | ICD-10-CM | POA: Diagnosis not present

## 2022-04-23 DIAGNOSIS — R7303 Prediabetes: Secondary | ICD-10-CM

## 2022-04-23 DIAGNOSIS — I10 Essential (primary) hypertension: Secondary | ICD-10-CM

## 2022-04-23 DIAGNOSIS — E785 Hyperlipidemia, unspecified: Secondary | ICD-10-CM | POA: Diagnosis not present
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
## 2022-04-23 NOTE — Patient Instructions (Addendum)
It was great to see you!  We are checking some labs today. We will send you a MyChart message with the results or call if needed.   Please complete the blue Advanced Directives packet and return it to our office at your earliest convenience. We have a notary here for you to sign/complete the 2nd to last page.  Schedule a Medicare Annual Wellness Visit on your way out today.  Take care and seek immediate care sooner if you develop any concerns.  Dr. [facility]    Things to do to keep yourself healthy  - Exercise at least 30-45 minutes a day, 3-4 days a week.  - Eat a diet with lots of fruits and vegetables (5 servings per day). - Avoid sugar sweetened beverages and processed foods whenever possible. - Seatbelts can save your life. Wear them always.  - Safe sex - use a condom to prevent STIs. - Alcohol -  If you drink, do it moderately, less than 2 drinks per day. - Sleep - aim for 7-8 hours nightly. - Limit screen time to no more than 2 hours per day.  - [place]. Choose someone to make decisions for you if you are not able.  - Depression is common in our stressful world. If you're feeling down or losing interest in things you normally enjoy, please come in for a visit.  - Violence - If anyone is threatening or hurting you, please call immediately.

## 2022-04-24 ENCOUNTER — Encounter: Payer: Self-pay | Admitting: Family Medicine

## 2022-04-24 DIAGNOSIS — R7303 Prediabetes: Secondary | ICD-10-CM | POA: Insufficient documentation

## 2022-04-24 LAB — BASIC METABOLIC PANEL
BUN/Creatinine Ratio: 13 (ref 12–28)
BUN: 12 mg/dL (ref 8–27)
CO2: 28 mmol/L (ref 20–29)
Calcium: 9.4 mg/dL (ref 8.7–10.3)
Chloride: 102 mmol/L (ref 96–106)
Creatinine, Ser: 0.94 mg/dL (ref 0.57–1.00)
Glucose: 97 mg/dL (ref 70–99)
Potassium: 3.2 mmol/L — ABNORMAL LOW (ref 3.5–5.2)
Sodium: 145 mmol/L — ABNORMAL HIGH (ref 134–144)
eGFR: 69 mL/min/{1.73_m2} (ref >59)

## 2022-04-24 LAB — LIPID PANEL
Chol/HDL Ratio: 2.5 ratio (ref 0.0–4.4)
Cholesterol, Total: 107 mg/dL (ref 100–199)
HDL: 43 mg/dL (ref >39)
LDL Chol Calc (NIH): 44 mg/dL (ref 0–99)
Triglycerides: 105 mg/dL (ref 0–149)
VLDL Cholesterol Cal: 20 mg/dL (ref 5–40)

## 2022-04-24 LAB — HEMOGLOBIN A1C
Est. average glucose Bld gHb Est-mCnc: 137 mg/dL
Hgb A1c MFr Bld: 6.4 % — ABNORMAL HIGH (ref 4.8–5.6)

## 2022-04-24 NOTE — Assessment & Plan Note (Signed)
Well-controlled. Continue valsartan-HCTZ 160-'25mg'$  and amlodipine '5mg'$  daily. Check updated BMP today

## 2022-04-24 NOTE — Assessment & Plan Note (Signed)
Well controlled on atorvastatin '20mg'$  daily. Check updated lipid panel today

## 2022-04-24 NOTE — Assessment & Plan Note (Signed)
Last A1c 6.0% in 2021. Patient was unaware of this diagnosis. Discussed. Check updated A1c.

## 2022-04-29 NOTE — Progress Notes (Signed)
I connected with  Debbie Clarke on 04/30/2022 by a audio enabled telemedicine application and verified that I am speaking with the correct person using two identifiers.  Patient Location: Home  Provider Location: Home Office  I discussed the limitations of evaluation and management by telemedicine. The patient expressed understanding and agreed to proceed.   Subjective:   Debbie Clarke is a 62 y.o. female who presents for an Initial Medicare Annual Wellness Visit.  Review of Systems    Per HPI unless specifically indicated below.        Objective:        04/23/2022    4:12 PM 08/24/2021   10:06 AM 05/26/2021    2:10 PM  Vitals with BMI  Height 5\' 3"  5\' 3"  5\' 3"   Weight 182 lbs 13 oz 178 lbs 10 oz 179 lbs 2 oz  BMI 32.39 Q000111Q Q000111Q  Systolic [id number] [id number]  Diastolic 69 60 61  Pulse 85 67 88    Today's Vitals   04/30/22 1108  PainSc: 0-No pain   There is no height or weight on file to calculate BMI.     04/30/2022   11:16 AM 04/23/2022    4:13 PM 08/24/2021   10:08 AM 05/26/2021    2:08 PM 01/04/2020    3:23 PM 12/17/2019    9:38 AM 10/20/2018    1:49 PM  Advanced Directives  Does Patient Have a Medical Advance Directive? No No No No No No No  Would patient like information on creating a medical advance directive? No - Patient declined No - Patient declined No - Patient declined No - Patient declined No - Patient declined No - Patient declined No - Patient declined    Current Medications (verified) Outpatient Encounter Medications as of 04/30/2022  Medication Sig   amLODipine (NORVASC) 5 MG tablet TAKE 1 TABLET BY MOUTH EVERY DAY   atorvastatin (LIPITOR) 20 MG tablet Take 1 tablet (20 mg total) by mouth daily.   valsartan-hydrochlorothiazide (DIOVAN-HCT) 160-25 MG tablet Take 1 tablet by mouth daily.   No facility-administered encounter medications on file as of 04/30/2022.    Allergies (verified) Patient has no known allergies.   History: Past Medical  History:  Diagnosis Date   Arthritis    Fibromyalgia    GERD (gastroesophageal reflux disease)    Hx of sickle cell trait    Hyperlipidemia    Hypertension    Prediabetes    Past Surgical History:  Procedure Laterality Date   ANTERIOR CERVICAL DECOMP/DISCECTOMY FUSION N/A 08/07/2012   Procedure: ANTERIOR CERVICAL DECOMPRESSION/DISCECTOMY FUSION 2 LEVELS;  Surgeon: Marybelle Killings, MD;  Location: [place];  Service: Orthopedics;  Laterality: N/A;  C5-6, C6-7 ACD&F, ALLOGRAFT AND PLATES   CESAREAN SECTION  1985, 1998   Family History  Problem Relation Age of Onset   Hypertension Father    Cancer Father    Social History   Socioeconomic History   Marital status: Single    Spouse name: Not on file   Number of children: 2   Years of education: Not on file   Highest education level: Not on file  Occupational History   Occupation: Disabled  Tobacco Use   Smoking status: Former    Packs/day: 0.25    Years: 20.00    Additional pack years: 0.00    Total pack years: 5.00    Types: Cigarettes    Quit date: 08/02/1998    Years since quitting: 23.7   Smokeless tobacco:  Never  Vaping Use   Vaping Use: Never used  Substance and Sexual Activity   Alcohol use: No   Drug use: Yes    Frequency: 21.0 times per week    Types: Marijuana    Comment: 3x daily   Sexual activity: Not Currently    Birth control/protection: Injection  Other Topics Concern   Not on file  Social History Narrative   Updated 7/12:    Lives with 21 yo son in an apartment.    HCPOA: daughter, Debbie Clarke [id number], lives in [place]    Witnessed father murder mother at the age of 74.    Therapist at [facility]: Debbie Clarke [phone]    Social Determinants of Health   Financial Resource Strain: Patient Unable To Answer (04/30/2022)   Overall Financial Resource Strain (CARDIA)    Difficulty of Paying Living Expenses: Patient unable to answer  Food Insecurity: No Food Insecurity (04/30/2022)   Hunger  Vital Sign    Worried About Running Out of Food in the Last Year: Never true    Ran Out of Food in the Last Year: Never true  Transportation Needs: No Transportation Needs (04/30/2022)   PRAPARE - Hydrologist (Medical): No    Lack of Transportation (Non-Medical): No  Physical Activity: Sufficiently Active (04/30/2022)   Exercise Vital Sign    Days of Exercise per Week: 7 days    Minutes of Exercise per Session: 60 min  Stress: Stress Concern Present (04/30/2022)   [place]    Feeling of Stress : Very much  Social Connections: Socially Isolated (04/30/2022)   Social Connection and Isolation Panel [NHANES]    Frequency of Communication with Friends and Family: More than three times a week    Frequency of Social Gatherings with Friends and Family: Never    Attends Religious Services: Never    Marine scientist or Organizations: No    Attends Music therapist: Never    Marital Status: Never married    Tobacco Counseling Counseling given: No   Clinical Intake:  Pre-visit preparation completed: No  Pain : No/denies pain Pain Score: 0-No pain     Nutritional Status: BMI > 30  Obese Nutritional Risks: None Diabetes: No  How often do you need to have someone help you when you read instructions, pamphlets, or other written materials from your doctor or pharmacy?: 1 - Never  Diabetic?No  Interpreter Needed?: No  Information entered by :: Donnie Mesa, CMA   Activities of Daily Living    04/30/2022   11:06 AM  In your present state of health, do you have any difficulty performing the following activities:  Hearing? 0  Vision? [place]  Difficulty concentrating or making decisions? 0  Walking or climbing stairs? 1  Dressing or bathing? 0  Doing errands, shopping? 0    Patient Care Team: Zola Button, MD as PCP - General (Family  Medicine)  Indicate any recent Medical Services you may have received from other than [facility] providers in the past year (date may be approximate).     Assessment:   This is a routine wellness examination for Debbie Clarke.  Hearing/Vision screen Denies any hearing issues. Denies any vision changes. Annual Eye Exam. [facility]    Dietary issues and exercise activities discussed: Current Exercise Habits: Structured exercise class, Type of exercise: walking, Time (Minutes): 60, Frequency (Times/Week): 7, Weekly  Exercise (Minutes/Week): 420, Intensity: Moderate   Goals Addressed   None    Depression Screen    04/30/2022   11:05 AM 04/23/2022    4:13 PM 08/24/2021   10:07 AM 05/26/2021    2:08 PM 03/05/2020    4:35 PM 01/04/2020    3:30 PM 01/04/2020    3:26 PM  PHQ 2/9 Scores  PHQ - 2 Score 0 0 0  5 1 0  PHQ- 9 Score 2 3 1  15 3 9   Exception Documentation    Patient refusal       Fall Risk    04/30/2022   11:06 AM 03/01/2019    9:27 AM 10/20/2018    1:43 PM 09/19/2018   11:35 AM 01/09/2018    1:55 PM  [place] in the past year? 0 0 0 0 0  Number falls in past yr: 0 0     Injury with Fall? 0      Risk for fall due to : No Fall Risks      Follow up Falls evaluation completed Falls evaluation completed       [place]:  Any stairs in or around the home? No  If so, are there any without handrails? No  Home free of loose throw rugs in walkways, pet beds, electrical cords, etc? Yes  Adequate lighting in your home to reduce risk of falls? Yes   ASSISTIVE DEVICES UTILIZED TO PREVENT FALLS:  Life alert? No  Use of a cane, walker or w/c? No  Grab bars in the bathroom? No  Shower chair or bench in shower? No  Elevated toilet seat or a handicapped toilet? No   TIMED UP AND GO:  Was the test performed? Unable to perform, virtual appointment  Cognitive Function:        04/30/2022   11:07 AM  6CIT Screen  What Year? 0 points  What  month? 0 points  What time? 0 points  Count back from 20 0 points  Months in reverse 0 points  Repeat phrase 0 points  Total Score 0 points    Immunizations Immunization History  Administered Date(s) Administered   PFIZER(Purple Top)SARS-COV-2 Vaccination 05/12/2019, 06/05/2019, 12/17/2019   Pfizer Covid-19 Vaccine Bivalent Booster 62yrs & up 05/26/2021    TDAP status: Due, Education has been provided regarding the importance of this vaccine. Advised may receive this vaccine at local pharmacy or Health Dept. Aware to provide a copy of the vaccination record if obtained from local pharmacy or Health Dept. Verbalized acceptance and understanding.  Flu Vaccine status: Due, Education has been provided regarding the importance of this vaccine. Advised may receive this vaccine at local pharmacy or Health Dept. Aware to provide a copy of the vaccination record if obtained from local pharmacy or Health Dept. Verbalized acceptance and understanding.  Pneumococcal vaccine: not applicable   0000000 vaccine status: Information provided on how to obtain vaccines.   Qualifies for Shingles Vaccine? Yes   Zostavax completed No   Shingrix Completed?: No.    Education has been provided regarding the importance of this vaccine. Patient has been advised to call insurance company to determine out of pocket expense if they have not yet received this vaccine. Advised may also receive vaccine at local pharmacy or Health Dept. Verbalized acceptance and understanding.  Screening Tests Health Maintenance  Topic Date Due   DTaP/Tdap/Td (1 - Tdap) Never done   Zoster Vaccines- Shingrix (1 of  2) Never done   COVID-19 Vaccine (5 - 2023-24 season) 10/16/2021   INFLUENZA VACCINE  05/16/2022 (Originally 09/15/2021)   PAP SMEAR-Modifier  03/06/2023   Medicare Annual Wellness (AWV)  04/30/2023   MAMMOGRAM  05/30/2023   COLONOSCOPY (Pts 45-37yrs Insurance coverage will need to be confirmed)  10/30/2023   Hepatitis C  Screening  Completed   HIV Screening  Completed   HPV VACCINES  Aged Out    Health Maintenance  Health Maintenance Due  Topic Date Due   DTaP/Tdap/Td (1 - Tdap) Never done   Zoster Vaccines- Shingrix (1 of 2) Never done   COVID-19 Vaccine (5 - 2023-24 season) 10/16/2021    Colorectal cancer screening: Type of screening: Colonoscopy. Completed 10/29/2013. Repeat every 10 years  Mammogram status: Completed 05/29/21. Repeat every year  DEXA Scan:  not applicable   Lung Cancer Screening: (Low Dose CT Chest recommended if Age 32-80 years, 30 pack-year currently smoking OR have quit w/in 15years.) does not qualify.   Lung Cancer Screening Referral: not applicable   Additional Screening:  Hepatitis C Screening: does qualify; Completed 10/07/2014  Vision Screening: Recommended annual ophthalmology exams for early detection of glaucoma and other disorders of the eye. Is the patient up to date with their annual eye exam?  Yes  Who is the provider or what is the name of the office in which the patient attends annual eye exams? [facility]  If pt is not established with a provider, would they like to be referred to a provider to establish care? No .   Dental Screening: Recommended annual dental exams for proper oral hygiene  Community Resource Referral / Chronic Care Management: CRR required this visit?  No   CCM required this visit?  No      Plan:     I have personally reviewed and noted the following in the patient's chart:   Medical and social history Use of alcohol, tobacco or illicit drugs  Current medications and supplements including opioid prescriptions. Patient is not currently taking opioid prescriptions. Functional ability and status Nutritional status Physical activity Advanced directives List of other physicians Hospitalizations, surgeries, and ER visits in previous 12 months Vitals Screenings to include cognitive, depression, and falls Referrals and  appointments  In addition, I have reviewed and discussed with patient certain preventive protocols, quality metrics, and best practice recommendations. A written personalized care plan for preventive services as well as general preventive health recommendations were provided to patient.    Ms. Lehnert , Thank you for taking time to come for your Medicare Wellness Visit. I appreciate your ongoing commitment to your health goals. Please review the following plan we discussed and let me know if I can assist you in the future.   These are the goals we discussed:  Goals   None     This is a list of the screening recommended for you and due dates:  Health Maintenance  Topic Date Due   DTaP/Tdap/Td vaccine (1 - Tdap) Never done   Zoster (Shingles) Vaccine (1 of 2) Never done   COVID-19 Vaccine (5 - 2023-24 season) 10/16/2021   Flu Shot  05/16/2022*   Pap Smear  03/06/2023   Medicare Annual Wellness Visit  04/30/2023   Mammogram  05/30/2023   Colon Cancer Screening  10/30/2023   Hepatitis C Screening: USPSTF Recommendation to screen - Ages 18-79 yo.  Completed   HIV Screening  Completed   HPV Vaccine  Aged Out  *Topic was postponed.  The date shown is not the original due date.     Wilson Singer, [place]   04/30/2022  Nurse Notes: Approximately 30 minute Non-Face -To-Face Medicare Wellness Visit

## 2022-04-29 NOTE — Patient Instructions (Signed)

## 2022-04-30 ENCOUNTER — Ambulatory Visit (INDEPENDENT_AMBULATORY_CARE_PROVIDER_SITE_OTHER): Payer: 59

## 2022-04-30 ENCOUNTER — Telehealth: Payer: Self-pay | Admitting: Family Medicine

## 2022-04-30 DIAGNOSIS — Z0000 Encounter for general adult medical examination without abnormal findings: Secondary | ICD-10-CM | POA: Diagnosis not present

## 2022-04-30 NOTE — Telephone Encounter (Signed)
Called patient to discuss lab results from recent visit. Informed of the following:  Normal lipid panel-- continue atorvastatin 20mg  daily. A1c 6.4% c/w prediabetes-- patient drinks regular soda daily and will work on eliminating this. BMP with low potassium-- patient reports she is unable to tolerate PO potassium as it causes nausea/vomiting. Given her history of HTN on three meds (including ARB) and persistent hypokalemia, would consider additional workup or just switching to spironolactone. Appointment scheduled with PCP, Dr Nancy Fetter on 4/5 to discuss further.   Alcus Dad, MD PGY-3, [place]

## 2022-05-21 ENCOUNTER — Ambulatory Visit (INDEPENDENT_AMBULATORY_CARE_PROVIDER_SITE_OTHER): Payer: 59 | Admitting: Family Medicine

## 2022-05-21 ENCOUNTER — Encounter: Payer: Self-pay | Admitting: Family Medicine

## 2022-05-21 VITALS — BP 120/60 | HR 74 | Ht 63.0 in | Wt 179.0 lb

## 2022-05-21 DIAGNOSIS — I10 Essential (primary) hypertension: Secondary | ICD-10-CM | POA: Diagnosis not present

## 2022-05-21 DIAGNOSIS — E876 Hypokalemia: Secondary | ICD-10-CM | POA: Diagnosis not present

## 2022-05-21 NOTE — Progress Notes (Signed)
    SUBJECTIVE:   CHIEF COMPLAINT / HPI:  Chief Complaint  Patient presents with   Hypertension    At last visit with my colleague Dr. Anner Crete 1 month ago she was here for routine physical and had lab work checked and noted to have mild hypokalemia.  Apparently did not tolerate oral potassium supplementation.  She was advised to follow-up today.  Ambulatory readings in the 100s-110s/60s-70s. Highest 120s.  Since last physical, patient reports that she has made changes in her diet.  She has cut out soda.  PERTINENT  PMH / PSH: Bipolar disorder, HTN, HLD, prediabetes  Patient Care Team: Littie Deeds, MD as PCP - General (Family Medicine)   OBJECTIVE:   BP 120/60   Pulse 74   Ht 5\' 3"  (1.6 m)   Wt 179 lb (81.2 kg)   LMP 07/06/2012   SpO2 98%   BMI 31.71 kg/m   Physical Exam Constitutional:      General: She is not in acute distress. Cardiovascular:     Rate and Rhythm: Normal rate and regular rhythm.  Pulmonary:     Effort: Pulmonary effort is normal. No respiratory distress.     Breath sounds: Normal breath sounds.  Musculoskeletal:     Cervical back: Neck supple.  Neurological:     Mental Status: She is alert.         05/21/2022   11:19 AM  Depression screen PHQ 2/9  Decreased Interest 0  Down, Depressed, Hopeless 0  PHQ - 2 Score 0  Altered sleeping 1  Tired, decreased energy 0  Change in appetite 0  Feeling bad or failure about yourself  0  Trouble concentrating 0  Moving slowly or fidgety/restless 0  Suicidal thoughts 0  PHQ-9 Score 1     {Show previous vital signs (optional):23777}    ASSESSMENT/PLAN:   HYPERTENSION, BENIGN ESSENTIAL Well-controlled.  Given she is on multiple antihypertensives and has had persistent mild hypokalemia, there is possibility that she may have hyperaldosteronism.  Discussed either discontinuing HCTZ versus obtaining lab testing, with shared decision making she wanted to get tested for hyperaldosteronism. - check ARR -  repeat BMP - if ARR normal, will plan to discontinue HCTZ    Return in about 6 months (around 11/20/2022) for f/u HTN.   Littie Deeds, MD [facility]

## 2022-05-21 NOTE — Assessment & Plan Note (Addendum)
Well-controlled.  Given she is on multiple antihypertensives and has had persistent mild hypokalemia, there is possibility that she may have hyperaldosteronism.  Discussed either discontinuing HCTZ versus obtaining lab testing, with shared decision making she wanted to get tested for hyperaldosteronism. - check ARR - repeat BMP - if ARR normal, will plan to discontinue HCTZ

## 2022-05-21 NOTE — Patient Instructions (Addendum)
It was nice seeing you today!  I will let you know what to do with your medications once the blood tests results.  Stay well, Littie Deeds, MD [facility] [phone]  --  Make sure to check out at the front desk before you leave today.  Please arrive at least 15 minutes prior to your scheduled appointments.  If you had blood work today, I will send you a MyChart message or a letter if results are normal. Otherwise, I will give you a call.  If you had a referral placed, they will call you to set up an appointment. Please give [place] a call if you don't hear back in the next 2 weeks.  If you need additional refills before your next appointment, please call your pharmacy first.

## 2022-05-28 LAB — BASIC METABOLIC PANEL
BUN/Creatinine Ratio: 12 (ref 12–28)
BUN: 8 mg/dL (ref 8–27)
CO2: 25 mmol/L (ref 20–29)
Calcium: 9.9 mg/dL (ref 8.7–10.3)
Chloride: 98 mmol/L (ref 96–106)
Creatinine, Ser: 0.67 mg/dL (ref 0.57–1.00)
Glucose: 91 mg/dL (ref 70–99)
Potassium: 3.5 mmol/L (ref 3.5–5.2)
Sodium: 140 mmol/L (ref 134–144)
eGFR: 99 mL/min/{1.73_m2} (ref >59)

## 2022-05-28 LAB — ALDOSTERONE + RENIN ACTIVITY W/ RATIO
Aldos/Renin Ratio: 0.1 (ref 0.0–30.0)
Aldosterone: 6 ng/dL (ref 0.0–30.0)
Renin Activity, Plasma: 58.022 ng/mL/hr — ABNORMAL HIGH (ref 0.167–5.380)

## 2022-06-16 ENCOUNTER — Other Ambulatory Visit: Payer: Self-pay | Admitting: Family Medicine

## 2022-06-16 DIAGNOSIS — Z1231 Encounter for screening mammogram for malignant neoplasm of breast: Secondary | ICD-10-CM

## 2022-07-14 ENCOUNTER — Other Ambulatory Visit: Payer: Self-pay | Admitting: Family Medicine

## 2022-07-14 DIAGNOSIS — I10 Essential (primary) hypertension: Secondary | ICD-10-CM

## 2022-07-15 ENCOUNTER — Telehealth: Payer: Self-pay

## 2022-07-15 NOTE — Telephone Encounter (Signed)
Patient called and informed. Patient asking about A1c results. Advised that these were not drawn at last visit.   Per results on 3/8, she is in prediabetic range.   Advised that she follow up in June for A1c check up.   Patient verbalizes understanding and is appreciative of call.   Veronda Prude, RN

## 2022-07-15 NOTE — Telephone Encounter (Signed)
Patient calls nurse line in requesting results from last month.   She wants to make sure she is taking all the right medications.   She reports taking Amlodipine, Atorvastatin and Valsartan-HCTZ.   Will forward to PCP.

## 2022-07-15 NOTE — Telephone Encounter (Signed)
Please let her know normal lab results with normal potassium and negative test for primary hyperaldosteronism.  No changes to her medications needed.  MyChart message was previously sent.

## 2022-07-23 ENCOUNTER — Other Ambulatory Visit: Payer: Self-pay | Admitting: Family Medicine

## 2022-07-23 ENCOUNTER — Ambulatory Visit: Payer: 59

## 2022-07-23 DIAGNOSIS — I10 Essential (primary) hypertension: Secondary | ICD-10-CM

## 2022-07-30 ENCOUNTER — Ambulatory Visit
Admission: RE | Admit: 2022-07-30 | Discharge: 2022-07-30 | Disposition: A | Discharge status: Home / Self Care | Payer: 59 | Source: Ambulatory Visit | Attending: Family Medicine | Admitting: Family Medicine

## 2022-07-30 DIAGNOSIS — Z1231 Encounter for screening mammogram for malignant neoplasm of breast: Secondary | ICD-10-CM

## 2023-04-04 ENCOUNTER — Ambulatory Visit: Payer: 59 | Admitting: Family Medicine

## 2023-04-26 ENCOUNTER — Other Ambulatory Visit: Payer: Self-pay

## 2023-04-26 DIAGNOSIS — I10 Essential (primary) hypertension: Secondary | ICD-10-CM

## 2023-04-26 MED ORDER — AMLODIPINE BESYLATE 5 MG PO TABS: 5.0000 mg | ORAL_TABLET | Freq: Every day | ORAL | 1 refills | Status: DC

## 2023-04-26 MED ORDER — VALSARTAN-HYDROCHLOROTHIAZIDE 160-25 MG PO TABS: 1.0000 | ORAL_TABLET | Freq: Every day | ORAL | 1 refills | Status: DC

## 2023-04-28 ENCOUNTER — Ambulatory Visit: Payer: 59 | Admitting: Family Medicine

## 2023-05-02 DIAGNOSIS — Z7982 Long term (current) use of aspirin: Secondary | ICD-10-CM | POA: Diagnosis not present

## 2023-05-02 DIAGNOSIS — R079 Chest pain, unspecified: Secondary | ICD-10-CM | POA: Diagnosis not present

## 2023-05-02 DIAGNOSIS — I2510 Atherosclerotic heart disease of native coronary artery without angina pectoris: Secondary | ICD-10-CM | POA: Diagnosis not present

## 2023-05-02 DIAGNOSIS — E785 Hyperlipidemia, unspecified: Secondary | ICD-10-CM | POA: Diagnosis not present

## 2023-05-02 DIAGNOSIS — R931 Abnormal findings on diagnostic imaging of heart and coronary circulation: Secondary | ICD-10-CM | POA: Diagnosis not present

## 2023-05-02 DIAGNOSIS — I159 Secondary hypertension, unspecified: Secondary | ICD-10-CM | POA: Diagnosis not present

## 2023-05-02 DIAGNOSIS — E876 Hypokalemia: Secondary | ICD-10-CM | POA: Diagnosis not present

## 2023-05-02 DIAGNOSIS — R9431 Abnormal electrocardiogram [ECG] [EKG]: Secondary | ICD-10-CM | POA: Diagnosis not present

## 2023-05-02 DIAGNOSIS — D649 Anemia, unspecified: Secondary | ICD-10-CM | POA: Diagnosis not present

## 2023-05-02 DIAGNOSIS — R0789 Other chest pain: Secondary | ICD-10-CM | POA: Diagnosis not present

## 2023-05-02 DIAGNOSIS — I10 Essential (primary) hypertension: Secondary | ICD-10-CM | POA: Diagnosis not present

## 2023-05-02 DIAGNOSIS — Z79899 Other long term (current) drug therapy: Secondary | ICD-10-CM | POA: Diagnosis not present

## 2023-05-03 DIAGNOSIS — I10 Essential (primary) hypertension: Secondary | ICD-10-CM | POA: Diagnosis not present

## 2023-05-03 DIAGNOSIS — E876 Hypokalemia: Secondary | ICD-10-CM | POA: Diagnosis not present

## 2023-05-03 DIAGNOSIS — R0789 Other chest pain: Secondary | ICD-10-CM | POA: Diagnosis not present

## 2023-05-03 DIAGNOSIS — D508 Other iron deficiency anemias: Secondary | ICD-10-CM | POA: Diagnosis not present

## 2023-05-03 DIAGNOSIS — I5189 Other ill-defined heart diseases: Secondary | ICD-10-CM | POA: Diagnosis not present

## 2023-05-03 DIAGNOSIS — E7849 Other hyperlipidemia: Secondary | ICD-10-CM | POA: Diagnosis not present

## 2023-10-18 ENCOUNTER — Other Ambulatory Visit: Payer: Self-pay

## 2023-10-18 ENCOUNTER — Other Ambulatory Visit: Payer: Self-pay | Admitting: Family Medicine

## 2023-10-18 DIAGNOSIS — I10 Essential (primary) hypertension: Secondary | ICD-10-CM

## 2023-10-18 MED ORDER — ATORVASTATIN CALCIUM 20 MG PO TABS: 20.0000 mg | ORAL_TABLET | Freq: Every day | ORAL | 3 refills | Status: AC

## 2023-10-22 ENCOUNTER — Other Ambulatory Visit: Payer: Self-pay | Admitting: Family Medicine

## 2023-10-22 DIAGNOSIS — I10 Essential (primary) hypertension: Secondary | ICD-10-CM
